# Patient Record
Sex: Male | Born: 1960 | Race: White | Hispanic: No | Marital: Single | Smoking: Never smoker
Health system: Southern US, Community
[De-identification: ages and names within clinical notes are randomized; demographics above are authoritative.]

## PROBLEM LIST (undated history)

## (undated) DIAGNOSIS — Z789 Other specified health status: Secondary | ICD-10-CM

## (undated) DIAGNOSIS — L570 Actinic keratosis: Secondary | ICD-10-CM

## (undated) HISTORY — DX: Actinic keratosis: L57.0

## (undated) HISTORY — PX: APPENDECTOMY: SHX54

---

## 2007-07-08 ENCOUNTER — Emergency Department: Payer: Self-pay | Admitting: Emergency Medicine

## 2011-05-16 ENCOUNTER — Emergency Department: Payer: Self-pay | Admitting: Emergency Medicine

## 2013-09-28 DIAGNOSIS — C4491 Basal cell carcinoma of skin, unspecified: Secondary | ICD-10-CM

## 2013-09-28 HISTORY — DX: Basal cell carcinoma of skin, unspecified: C44.91

## 2014-05-12 ENCOUNTER — Emergency Department: Payer: Self-pay | Admitting: Internal Medicine

## 2015-06-23 ENCOUNTER — Emergency Department
Admission: EM | Admit: 2015-06-23 | Discharge: 2015-06-23 | Disposition: A | Discharge status: Home / Self Care | Payer: PRIVATE HEALTH INSURANCE | Attending: Emergency Medicine | Admitting: Emergency Medicine

## 2015-06-23 ENCOUNTER — Encounter: Payer: Self-pay | Admitting: Urgent Care

## 2015-06-23 DIAGNOSIS — B349 Viral infection, unspecified: Secondary | ICD-10-CM | POA: Insufficient documentation

## 2015-06-23 DIAGNOSIS — R51 Headache: Secondary | ICD-10-CM | POA: Diagnosis present

## 2015-06-23 NOTE — ED Notes (Signed)
Patient with no complaints at this time. Respirations even and unlabored. Skin warm/dry. Discharge instructions reviewed with patient at this time. Patient given opportunity to voice concerns/ask questions. Patient discharged at this time and left Emergency Department with steady gait.   

## 2015-06-23 NOTE — ED Provider Notes (Signed)
[facility] Emergency Department Provider Note  ____________________________________________  Time seen: Approximately 5:10 AM  I have reviewed the triage vital signs and the nursing notes.   HISTORY  Chief Complaint Facial Pain and Headache    HPI Alexander Atkinson is a 55 y.o. male with no significant past medical history who presents with a generalized headache and some pain in his face as well as repeated sneezing.  He reports that this happened acutely earlier this afternoon while he was receiving facial laser treatments at his dermatologist's "skin spa".  He has never had a reaction like this before.  After sneezing multiple times he developed a frontal headache, but before coming to the emergency department he took 2 ibuprofen and the headache is since resolved.  He is having a runny nose and some congestion.  He denies ear pain and sore throat.  He denies fever/chills, chest pain, shortness of breath, nausea, vomiting, abdominal pain, diarrhea.   History reviewed. No pertinent past medical history.  There are no active problems to display for this patient.   Past Surgical History  Procedure Laterality Date  . Appendectomy      No current outpatient prescriptions on file.  Allergies Review of patient's allergies indicates no known allergies.  No family history on file.  Social History Social History  Substance Use Topics  . Smoking status: Never Smoker   . Smokeless tobacco: None  . Alcohol Use: No    Review of Systems Constitutional: No fever/chills Eyes: No visual changes. ENT: No sore throat.  Runny nose.  No ear pain. Cardiovascular: Denies chest pain. Respiratory: Denies shortness of breath. Gastrointestinal: No abdominal pain.  No nausea, no vomiting.  No diarrhea.  No constipation. Genitourinary: Negative for dysuria. Musculoskeletal: Negative for back pain. Skin: Negative for rash. Neurological: Negative for headaches, focal  weakness or numbness.  10-point ROS otherwise negative.  ____________________________________________   PHYSICAL EXAM:  ED Triage Vitals  Enc Vitals Group     BP 06/23/15 0526 148/88 mmHg     Pulse Rate 06/23/15 0526 75     Resp 06/23/15 0526 17     Temp --      Temp src --      SpO2 06/23/15 0526 99 %     Weight --      Height --      Head Cir --      Peak Flow --      Pain Score 06/23/15 0232 0     Pain Loc --      Pain Edu? --      Excl. in [place]? --     Constitutional: Alert and oriented. Well appearing and in no acute distress. Eyes: Conjunctivae are normal. PERRL. EOMI. Head: Atraumatic.  No maxillary or frontal sinus tenderness to palpation.  Ear canals and TMs are healthy and well-appearing. Nose: Mild clear rhinorrhea. Mouth/Throat: Mucous membranes are moist.  Oropharynx non-erythematous.  No exudate or petechiae on the palate. Neck: No stridor.  No meningismus. Cardiovascular: Normal rate, regular rhythm. Grossly normal heart sounds.  Good peripheral circulation. Respiratory: Normal respiratory effort.  No retractions. Lungs CTAB. Gastrointestinal: Soft and nontender. No distention. No abdominal bruits. No CVA tenderness. Skin:  Skin is warm, dry and intact. No rash noted. Psychiatric: Mood and affect are normal. Speech and behavior are normal.  ____________________________________________   LABS (all labs ordered are listed, but only abnormal results are displayed)  Labs Reviewed - No data to display ____________________________________________  EKG  None ____________________________________________  RADIOLOGY   No results found.  ____________________________________________   PROCEDURES  Procedure(s) performed: None  Critical Care performed: No ____________________________________________   INITIAL IMPRESSION / ASSESSMENT AND PLAN / ED COURSE  Pertinent labs & imaging results that were available during my care of the patient were  reviewed by me and considered in my medical decision making (see chart for details).  The patient's pain has resolved and he has symptoms most suggestive of a viral syndrome.  He himself states "I think I have a cold".  There is no indication for antibiotics for sinusitis.  He and I discussed obtaining imaging of his sinuses but I explained that I find it unlikely it would change our plan of care as antibiotics are really indicated in this type situation and he agrees with no additional evaluation at this time.  He is well-appearing and in no acute distress with normal vital signs.  I will discharge him for outpatient follow-up.  I gave my usual and customary return precautions.     ____________________________________________  FINAL CLINICAL IMPRESSION(S) / ED DIAGNOSES  Final diagnoses:  Viral syndrome      NEW MEDICATIONS STARTED DURING THIS VISIT:  New Prescriptions   No medications on file     Hinda Kehr, MD 06/23/15 0530

## 2015-06-23 NOTE — ED Notes (Signed)
Patient presents with non-specific headache and pain to his maxillary and frontal sinus area. Patient reports that pain somewhat subsided after taking some "equate". Patient advising that he started feeling bad yesterday after he went to have a "skin treatment" - "I started sneezing and I couldn't stop. I sneezed for quite awhile, but I dont have allergies". Patient presents to triage in face mask and gloves. Patient removed gloves for triage, but them promptly replaced them when triage completed. Patient states, "I never get sick. I dont go to doctors."

## 2015-06-23 NOTE — Discharge Instructions (Signed)
You have been seen in the Emergency Department (ED) today for a likely viral illness.  Please drink plenty of clear fluids (water, Gatorade, chicken broth, etc).  You may use Tylenol and/or Motrin according to label instructions.  You can alternate between the two without any side effects.   Please follow up with your doctor as listed above.  Call your doctor or return to the Emergency Department (ED) if you are unable to tolerate fluids due to vomiting, have worsening trouble breathing, become extremely tired or difficult to awaken, or if you develop any other symptoms that concern you.   Viral Infections A viral infection can be caused by different types of viruses.Most viral infections are not serious and resolve on their own. However, some infections may cause severe symptoms and may lead to further complications. SYMPTOMS Viruses can frequently cause:  Minor sore throat.  Aches and pains.  Headaches.  Runny nose.  Different types of rashes.  Watery eyes.  Tiredness.  Cough.  Loss of appetite.  Gastrointestinal infections, resulting in nausea, vomiting, and diarrhea. These symptoms do not respond to antibiotics because the infection is not caused by bacteria. However, you might catch a bacterial infection following the viral infection. This is sometimes called a "superinfection." Symptoms of such a bacterial infection may include:  Worsening sore throat with pus and difficulty swallowing.  Swollen neck glands.  Chills and a high or persistent fever.  Severe headache.  Tenderness over the sinuses.  Persistent overall ill feeling (malaise), muscle aches, and tiredness (fatigue).  Persistent cough.  Yellow, green, or brown mucus production with coughing. HOME CARE INSTRUCTIONS   Only take over-the-counter or prescription medicines for pain, discomfort, diarrhea, or fever as directed by your caregiver.  Drink enough water and fluids to keep your urine clear or  pale yellow. Sports drinks can provide valuable electrolytes, sugars, and hydration.  Get plenty of rest and maintain proper nutrition. Soups and broths with crackers or rice are fine. SEEK IMMEDIATE MEDICAL CARE IF:   You have severe headaches, shortness of breath, chest pain, neck pain, or an unusual rash.  You have uncontrolled vomiting, diarrhea, or you are unable to keep down fluids.  You or your child has an oral temperature above 102 F (38.9 C), not controlled by medicine.  Your baby is older than 3 months with a rectal temperature of 102 F (38.9 C) or higher.  Your baby is 58 months old or younger with a rectal temperature of 100.4 F (38 C) or higher. MAKE SURE YOU:   Understand these instructions.  Will watch your condition.  Will get help right away if you are not doing well or get worse.   This information is not intended to replace advice given to you by your health care provider. Make sure you discuss any questions you have with your health care provider.   Document Released: 02/07/2005 Document Revised: 07/23/2011 Document Reviewed: 10/06/2014 Elsevier Interactive Patient Education [employer].

## 2015-08-29 ENCOUNTER — Encounter: Payer: Self-pay | Admitting: Family Medicine

## 2015-08-29 ENCOUNTER — Ambulatory Visit (INDEPENDENT_AMBULATORY_CARE_PROVIDER_SITE_OTHER): Payer: PRIVATE HEALTH INSURANCE | Admitting: Family Medicine

## 2015-08-29 VITALS — BP 114/70 | HR 90 | Temp 97.5°F | Resp 16 | Ht 69.0 in | Wt 245.8 lb

## 2015-08-29 DIAGNOSIS — Z7689 Persons encountering health services in other specified circumstances: Secondary | ICD-10-CM

## 2015-08-29 DIAGNOSIS — E669 Obesity, unspecified: Secondary | ICD-10-CM | POA: Insufficient documentation

## 2015-08-29 DIAGNOSIS — Z0000 Encounter for general adult medical examination without abnormal findings: Secondary | ICD-10-CM | POA: Diagnosis not present

## 2015-08-29 DIAGNOSIS — Z7189 Other specified counseling: Secondary | ICD-10-CM

## 2015-08-29 NOTE — Progress Notes (Signed)
Name: Alexander Atkinson   MRN: [id number]    DOB: Oct 01, 1960   Date:08/29/2015       Progress Note  Subjective  Chief Complaint  Chief Complaint  Patient presents with  . Establish Care    HPI Here to establish care.  He has no major medical problems.  He takes no medicines.  His only c/o is that he needs a physical and needs a colonoscopy.  He is overweight.  No problem-specific assessment & plan notes found for this encounter.   History reviewed. No pertinent past medical history.  Past Surgical History  Procedure Laterality Date  . Appendectomy      Family History  Problem Relation Age of Onset  . Kidney disease Maternal Grandfather     Social History   Social History  . Marital Status: Single    Spouse Name: N/A  . Number of Children: N/A  . Years of Education: N/A   Occupational History  . Not on file.   Social History Main Topics  . Smoking status: Never Smoker   . Smokeless tobacco: Never Used  . Alcohol Use: No  . Drug Use: No  . Sexual Activity: Not on file   Other Topics Concern  . Not on file   Social History Narrative    No current outpatient prescriptions on file.  Not on File   Review of Systems  Constitutional: Negative for fever, chills, weight loss and malaise/fatigue.  HENT: Negative for hearing loss.   Eyes: Negative for blurred vision and double vision.  Respiratory: Negative for cough, shortness of breath and wheezing.   Cardiovascular: Negative for chest pain, palpitations and leg swelling.  Gastrointestinal: Negative for heartburn, abdominal pain and blood in stool.  Genitourinary: Negative for dysuria, urgency and frequency.  Musculoskeletal: Negative for myalgias and joint pain.  Skin: Negative for rash.  Neurological: Negative for dizziness, tremors, weakness and headaches.  Psychiatric/Behavioral: Negative for depression. The patient is not nervous/anxious and does not have insomnia.       Objective  Filed Vitals:   08/29/15 1036  BP: 114/70  Pulse: 90  Temp: 97.5 F (36.4 C)  TempSrc: Oral  Resp: 16  Height: 5\' 9"  (1.753 m)  Weight: 245 lb 12.8 oz (111.494 kg)    Physical Exam  Constitutional: He is oriented to person, place, and time. He appears distressed.  HENT:  Head: Normocephalic and atraumatic.  Eyes: Conjunctivae and EOM are normal. Pupils are equal, round, and reactive to light. No scleral icterus.  Neck: Normal range of motion. Neck supple. No thyromegaly present.  Cardiovascular: Normal rate, regular rhythm, normal heart sounds and intact distal pulses.  Exam reveals no gallop and no friction rub.   No murmur heard. Pulmonary/Chest: Effort normal and breath sounds normal. No respiratory distress. He has no wheezes. He has no rales.  Abdominal: Soft. Bowel sounds are normal. He exhibits no distension and no mass. There is no tenderness.  obese  Musculoskeletal: He exhibits no edema.  Lymphadenopathy:    He has no cervical adenopathy.  Neurological: He is alert and oriented to person, place, and time.  Skin: Skin is warm and dry. No rash noted. No erythema. No pallor.  Psychiatric: Mood, memory, affect and judgment normal.  Vitals reviewed.      No results found for this or any previous visit (from the past 2160 hour(s)).   Assessment & Plan  Problem List Items Addressed This Visit      Other   Obese  Relevant Orders   Comprehensive Metabolic Panel (CMET)   CBC with Differential   Lipid Profile   TSH   Encounter to establish care - Primary   Health care maintenance   Relevant Orders   Ambulatory referral to Gastroenterology   PSA      No orders of the defined types were placed in this encounter.   1. Encounter to establish care   2. Obese  - Comprehensive Metabolic Panel (CMET) - CBC with Differential - Lipid Profile - TSH  3. Health care maintenance  - Ambulatory referral to Gastroenterology - PSA

## 2015-08-30 LAB — CBC WITH DIFFERENTIAL/PLATELET
Basophils Absolute: 0.1 10*3/uL (ref 0.0–0.2)
Basos: 1 %
EOS (ABSOLUTE): 0.1 10*3/uL (ref 0.0–0.4)
EOS: 2 %
HEMATOCRIT: 45.9 % (ref 37.5–51.0)
HEMOGLOBIN: 16.1 g/dL (ref 12.6–17.7)
IMMATURE GRANS (ABS): 0 10*3/uL (ref 0.0–0.1)
IMMATURE GRANULOCYTES: 1 %
LYMPHS ABS: 2.1 10*3/uL (ref 0.7–3.1)
Lymphs: 34 %
MCH: 33.3 pg — ABNORMAL HIGH (ref 26.6–33.0)
MCHC: 35.1 g/dL (ref 31.5–35.7)
MCV: 95 fL (ref 79–97)
Monocytes Absolute: 0.6 10*3/uL (ref 0.1–0.9)
Monocytes: 10 %
Neutrophils Absolute: 3.3 10*3/uL (ref 1.4–7.0)
Neutrophils: 52 %
Platelets: 275 10*3/uL (ref 150–379)
RBC: 4.84 x10E6/uL (ref 4.14–5.80)
RDW: 13.5 % (ref 12.3–15.4)
WBC: 6.2 10*3/uL (ref 3.4–10.8)

## 2015-08-30 LAB — COMPREHENSIVE METABOLIC PANEL
A/G RATIO: 1.4 (ref 1.2–2.2)
ALBUMIN: 4.2 g/dL (ref 3.5–5.5)
ALK PHOS: 79 IU/L (ref 39–117)
ALT: 23 IU/L (ref 0–44)
AST: 21 IU/L (ref 0–40)
BILIRUBIN TOTAL: 0.8 mg/dL (ref 0.0–1.2)
BUN / CREAT RATIO: 21 — AB (ref 9–20)
BUN: 14 mg/dL (ref 6–24)
CO2: 21 mmol/L (ref 18–29)
CREATININE: 0.68 mg/dL — AB (ref 0.76–1.27)
Calcium: 9.2 mg/dL (ref 8.7–10.2)
Chloride: 100 mmol/L (ref 96–106)
GFR calc Af Amer: 123 mL/min/{1.73_m2} (ref >59)
GFR calc non Af Amer: 107 mL/min/{1.73_m2} (ref >59)
GLOBULIN, TOTAL: 3.1 g/dL (ref 1.5–4.5)
Glucose: 131 mg/dL — ABNORMAL HIGH (ref 65–99)
POTASSIUM: 4.2 mmol/L (ref 3.5–5.2)
SODIUM: 141 mmol/L (ref 134–144)
Total Protein: 7.3 g/dL (ref 6.0–8.5)

## 2015-08-30 LAB — LIPID PANEL
CHOL/HDL RATIO: 5.6 ratio — AB (ref 0.0–5.0)
Cholesterol, Total: 206 mg/dL — ABNORMAL HIGH (ref 100–199)
HDL: 37 mg/dL — AB (ref >39)
LDL CALC: 112 mg/dL — AB (ref 0–99)
TRIGLYCERIDES: 286 mg/dL — AB (ref 0–149)
VLDL CHOLESTEROL CAL: 57 mg/dL — AB (ref 5–40)

## 2015-08-30 LAB — PSA: Prostate Specific Ag, Serum: 0.6 ng/mL (ref 0.0–4.0)

## 2015-08-30 LAB — TSH: TSH: 2.85 u[IU]/mL (ref 0.450–4.500)

## 2015-08-31 LAB — SPECIMEN STATUS REPORT

## 2015-08-31 LAB — HGB A1C W/O EAG: Hgb A1c MFr Bld: 6.1 % — ABNORMAL HIGH (ref 4.8–5.6)

## 2015-09-14 ENCOUNTER — Telehealth: Payer: Self-pay | Admitting: Family Medicine

## 2015-09-14 NOTE — Telephone Encounter (Signed)
Patient made aware that Alexander Atkinson will call him to schedule and it make take several weeks. Patient given contact information to call.

## 2015-09-14 NOTE — Telephone Encounter (Signed)
Pt called to check the status of referral to Dr. Allen Norris for colonoscopy.  His call back  Is [phone]

## 2015-09-23 ENCOUNTER — Telehealth: Payer: Self-pay | Admitting: Gastroenterology

## 2015-09-23 NOTE — Telephone Encounter (Signed)
colonoscopy

## 2015-10-04 ENCOUNTER — Other Ambulatory Visit: Payer: Self-pay

## 2015-10-04 NOTE — Telephone Encounter (Signed)
Gastroenterology Pre-Procedure Review  Request Date: 11/22/15 Requesting Physician: Dr. Luan Pulling  PATIENT REVIEW QUESTIONS: The patient responded to the following health history questions as indicated:    1. Are you having any GI issues? no 2. Do you have a personal history of Polyps? no 3. Do you have a family history of Colon Cancer or Polyps? no 4. Diabetes Mellitus? no 5. Joint replacements in the past 12 months?no 6. Major health problems in the past 3 months?no 7. Any artificial heart valves, MVP, or defibrillator?no    MEDICATIONS & ALLERGIES:    Patient reports the following regarding taking any anticoagulation/antiplatelet therapy:   Plavix, Coumadin, Eliquis, Xarelto, Lovenox, Pradaxa, Brilinta, or Effient? no Aspirin? no  Patient confirms/reports the following medications:  No current outpatient prescriptions on file.   No current facility-administered medications for this visit.    Patient confirms/reports the following allergies:  Not on File  No orders of the defined types were placed in this encounter.    AUTHORIZATION INFORMATION Primary Insurance: 1D#: Group #:  Secondary Insurance: 1D#: Group #:  SCHEDULE INFORMATION: Date: 11/22/15 Time: Location: [facility]

## 2015-10-04 NOTE — Telephone Encounter (Signed)
Pt scheduled for screening colonoscopy at [facility] on 11/22/15. Please precert.

## 2015-10-25 ENCOUNTER — Encounter: Payer: Self-pay | Admitting: Emergency Medicine

## 2015-10-25 ENCOUNTER — Emergency Department
Admission: EM | Admit: 2015-10-25 | Discharge: 2015-10-25 | Disposition: A | Discharge status: Home / Self Care | Payer: PRIVATE HEALTH INSURANCE | Attending: Emergency Medicine | Admitting: Emergency Medicine

## 2015-10-25 ENCOUNTER — Emergency Department: Payer: PRIVATE HEALTH INSURANCE

## 2015-10-25 DIAGNOSIS — K6389 Other specified diseases of intestine: Secondary | ICD-10-CM

## 2015-10-25 DIAGNOSIS — K659 Peritonitis, unspecified: Secondary | ICD-10-CM | POA: Diagnosis not present

## 2015-10-25 DIAGNOSIS — R1032 Left lower quadrant pain: Secondary | ICD-10-CM | POA: Diagnosis present

## 2015-10-25 DIAGNOSIS — K529 Noninfective gastroenteritis and colitis, unspecified: Secondary | ICD-10-CM

## 2015-10-25 LAB — COMPREHENSIVE METABOLIC PANEL
ALT: 31 U/L (ref 17–63)
AST: 31 U/L (ref 15–41)
Albumin: 4.3 g/dL (ref 3.5–5.0)
Alkaline Phosphatase: 78 U/L (ref 38–126)
Anion gap: 11 (ref 5–15)
BILIRUBIN TOTAL: 0.9 mg/dL (ref 0.3–1.2)
BUN: 17 mg/dL (ref 6–20)
CALCIUM: 9.5 mg/dL (ref 8.9–10.3)
CO2: 22 mmol/L (ref 22–32)
CREATININE: 0.96 mg/dL (ref 0.61–1.24)
Chloride: 104 mmol/L (ref 101–111)
GFR calc Af Amer: >60 mL/min (ref >60)
Glucose, Bld: 104 mg/dL — ABNORMAL HIGH (ref 65–99)
Potassium: 3.8 mmol/L (ref 3.5–5.1)
Sodium: 137 mmol/L (ref 135–145)
TOTAL PROTEIN: 8 g/dL (ref 6.5–8.1)

## 2015-10-25 LAB — CBC
HCT: 45.2 % (ref 40.0–52.0)
Hemoglobin: 16 g/dL (ref 13.0–18.0)
MCH: 33 pg (ref 26.0–34.0)
MCHC: 35.4 g/dL (ref 32.0–36.0)
MCV: 93.1 fL (ref 80.0–100.0)
PLATELETS: 261 10*3/uL (ref 150–440)
RBC: 4.86 MIL/uL (ref 4.40–5.90)
RDW: 12.9 % (ref 11.5–14.5)
WBC: 7.6 10*3/uL (ref 3.8–10.6)

## 2015-10-25 LAB — URINALYSIS COMPLETE WITH MICROSCOPIC (ARMC ONLY)
BILIRUBIN URINE: NEGATIVE
Bacteria, UA: NONE SEEN
GLUCOSE, UA: NEGATIVE mg/dL
HGB URINE DIPSTICK: NEGATIVE
LEUKOCYTES UA: NEGATIVE
NITRITE: NEGATIVE
Protein, ur: NEGATIVE mg/dL
SPECIFIC GRAVITY, URINE: 1.023 (ref 1.005–1.030)
Squamous Epithelial / LPF: NONE SEEN
pH: 5 (ref 5.0–8.0)

## 2015-10-25 LAB — LIPASE, BLOOD: Lipase: 31 U/L (ref 11–51)

## 2015-10-25 MED ORDER — DIATRIZOATE MEGLUMINE & SODIUM 66-10 % PO SOLN
15.0000 mL | Freq: Once | ORAL | Status: AC
  Administered 2015-10-25: 15 mL via ORAL

## 2015-10-25 MED ORDER — ONDANSETRON 4 MG PO TBDP
4.0000 mg | ORAL_TABLET | Freq: Three times a day (TID) | ORAL | Status: DC | PRN
Start: 2015-10-25 — End: 2015-12-19

## 2015-10-25 MED ORDER — NAPROXEN 500 MG PO TABS: 500.0000 mg | ORAL_TABLET | Freq: Two times a day (BID) | ORAL | Status: DC

## 2015-10-25 MED ORDER — IOPAMIDOL (ISOVUE-300) INJECTION 61%
100.0000 mL | Freq: Once | INTRAVENOUS | Status: AC | PRN
  Administered 2015-10-25: 100 mL via INTRAVENOUS

## 2015-10-25 NOTE — ED Notes (Signed)
MD at bedside. 

## 2015-10-25 NOTE — ED Notes (Signed)
Pt here with LLQ pain, sent here from [facility]. Sent here for eval for diverticulitis.

## 2015-10-25 NOTE — ED Notes (Signed)
Patient transported to CT 

## 2015-10-25 NOTE — ED Provider Notes (Signed)
[facility] Emergency Department Provider Note  ____________________________________________  Time seen: 5:15 PM  I have reviewed the triage vital signs and the nursing notes.   HISTORY  Chief Complaint Abdominal Pain    HPI Tobe Kan is a 55 y.o. male who complains of left lower quadrant abdominal pain, gradual onset last night, constant, waxing and waning, overall worsening. Nonradiating. No nausea vomiting or diarrhea or other associated symptoms. Never had anything like this before. Sent from primary care for evaluation for diverticulitis. No history of diverticulitis or diverticulosis.  no active medical problems, takes no medications.   History reviewed. No pertinent past medical history.   Patient Active Problem List   Diagnosis Date Noted  . Obese 08/29/2015  . Encounter to establish care 08/29/2015  . Health care maintenance 08/29/2015     Past Surgical History  Procedure Laterality Date  . Appendectomy       Current Outpatient Rx  Name  Route  Sig  Dispense  Refill  . naproxen (NAPROSYN) 500 MG tablet   Oral   Take 1 tablet (500 mg total) by mouth 2 (two) times daily with a meal.   20 tablet   0   . ondansetron (ZOFRAN ODT) 4 MG disintegrating tablet   Oral   Take 1 tablet (4 mg total) by mouth every 8 (eight) hours as needed for nausea or vomiting.   20 tablet   0      Allergies Review of patient's allergies indicates no known allergies.   Family History  Problem Relation Age of Onset  . Kidney disease Maternal Grandfather     Social History Social History  Substance Use Topics  . Smoking status: Never Smoker   . Smokeless tobacco: Never Used  . Alcohol Use: No    Review of Systems  Constitutional:   No fever or chills.  Eyes:   No vision changes.  ENT:   No sore throat. No rhinorrhea. Cardiovascular:   No chest pain. Respiratory:   No dyspnea or cough. Gastrointestinal:  Positive left lower  quadrant abdominal pain without vomiting and diarrhea.  Genitourinary:   Negative for dysuria or difficulty urinating. Musculoskeletal:   Negative for focal pain or swelling Neurological:   Negative for headaches 10-point ROS otherwise negative.  ____________________________________________   PHYSICAL EXAM:  VITAL SIGNS: ED Triage Vitals  Enc Vitals Group     BP 10/25/15 1703 122/79 mmHg     Pulse Rate 10/25/15 1703 98     Resp 10/25/15 1703 16     Temp 10/25/15 1703 98.7 F (37.1 C)     Temp Source 10/25/15 1703 Oral     SpO2 10/25/15 1703 94 %     Weight 10/25/15 1703 228 lb (103.42 kg)     Height 10/25/15 1703 5\' 9"  (1.753 m)     Head Cir --      Peak Flow --      Pain Score 10/25/15 1703 4     Pain Loc --      Pain Edu? --      Excl. in [place]? --     Vital signs reviewed, nursing assessments reviewed.   Constitutional:   Alert and oriented. Well appearing and in no distress. Eyes:   No scleral icterus. No conjunctival pallor. PERRL. EOMI.  No nystagmus. ENT   Head:   Normocephalic and atraumatic.   Nose:   No congestion/rhinnorhea. No septal hematoma   Mouth/Throat:   MMM, no pharyngeal erythema.  No peritonsillar mass.    Neck:   No stridor. No SubQ emphysema. No meningismus. Hematological/Lymphatic/Immunilogical:   No cervical lymphadenopathy. Cardiovascular:   RRR. Symmetric bilateral radial and DP pulses.  No murmurs.  Respiratory:   Normal respiratory effort without tachypnea nor retractions. Breath sounds are clear and equal bilaterally. No wheezes/rales/rhonchi. Gastrointestinal:   Soft With focal tenderness in the left lower quadrant just lateral to the midline. No hernias.. Non distended. There is no CVA tenderness.  No rebound, rigidity, or guarding. Genitourinary:   deferred Musculoskeletal:   Nontender with normal range of motion in all extremities. No joint effusions.  No lower extremity tenderness.  No edema. Neurologic:   Normal speech and  language.  CN 2-10 normal. Motor grossly intact. No gross focal neurologic deficits are appreciated.  Skin:    Skin is warm, dry and intact. No rash noted.  No petechiae, purpura, or bullae.  ____________________________________________    LABS (pertinent positives/negatives) (all labs ordered are listed, but only abnormal results are displayed) Labs Reviewed  COMPREHENSIVE METABOLIC PANEL - Abnormal; Notable for the following:    Glucose, Bld 104 (*)    All other components within normal limits  URINALYSIS COMPLETEWITH MICROSCOPIC ([facility] ONLY) - Abnormal; Notable for the following:    Color, Urine YELLOW (*)    APPearance CLEAR (*)    Ketones, ur TRACE (*)    All other components within normal limits  LIPASE, BLOOD  CBC   ____________________________________________   EKG    ____________________________________________    RADIOLOGY  CT abdomen and pelvis reveals epiploic appendagitis of the descending colon.  ____________________________________________   PROCEDURES   ____________________________________________   INITIAL IMPRESSION / ASSESSMENT AND PLAN / ED COURSE  Pertinent labs & imaging results that were available during my care of the patient were reviewed by me and considered in my medical decision making (see chart for details).  Patient well appearing no acute distress. Normal vital signs. Normal white count. Epiploic appendagitis on CT. Does not warrant antibiotics at this time. Suitable for discharge home with NSAIDs, follow-up with primary care. Patient counseled on the usual course of this condition, usual return precautions given.     ____________________________________________   FINAL CLINICAL IMPRESSION(S) / ED DIAGNOSES  Final diagnoses:  Epiploic appendagitis  LLQ abdominal pain       Portions of this note were generated with dragon dictation software. Dictation errors may occur despite best attempts at  proofreading.   Carrie Mew, MD 10/25/15 [phone]

## 2015-10-25 NOTE — Discharge Instructions (Signed)

## 2015-11-22 ENCOUNTER — Ambulatory Visit: Payer: PRIVATE HEALTH INSURANCE | Admitting: Anesthesiology

## 2015-11-22 ENCOUNTER — Ambulatory Visit
Admission: RE | Admit: 2015-11-22 | Discharge: 2015-11-22 | Disposition: A | Discharge status: Home / Self Care | Payer: PRIVATE HEALTH INSURANCE | Source: Ambulatory Visit | Attending: Gastroenterology | Admitting: Gastroenterology

## 2015-11-22 ENCOUNTER — Encounter: Payer: Self-pay | Admitting: *Deleted

## 2015-11-22 ENCOUNTER — Encounter
Admission: RE | Disposition: A | Discharge status: Home / Self Care | Payer: Self-pay | Source: Ambulatory Visit | Attending: Gastroenterology

## 2015-11-22 DIAGNOSIS — K640 First degree hemorrhoids: Secondary | ICD-10-CM | POA: Insufficient documentation

## 2015-11-22 DIAGNOSIS — Z841 Family history of disorders of kidney and ureter: Secondary | ICD-10-CM | POA: Diagnosis not present

## 2015-11-22 DIAGNOSIS — Z79899 Other long term (current) drug therapy: Secondary | ICD-10-CM | POA: Insufficient documentation

## 2015-11-22 DIAGNOSIS — Z6832 Body mass index (BMI) 32.0-32.9, adult: Secondary | ICD-10-CM | POA: Diagnosis not present

## 2015-11-22 DIAGNOSIS — Z1211 Encounter for screening for malignant neoplasm of colon: Secondary | ICD-10-CM | POA: Insufficient documentation

## 2015-11-22 DIAGNOSIS — E669 Obesity, unspecified: Secondary | ICD-10-CM | POA: Insufficient documentation

## 2015-11-22 HISTORY — DX: Other specified health status: Z78.9

## 2015-11-22 HISTORY — PX: COLONOSCOPY WITH PROPOFOL: SHX5780

## 2015-11-22 SURGERY — COLONOSCOPY WITH PROPOFOL
Anesthesia: General

## 2015-11-22 MED ORDER — SODIUM CHLORIDE 0.9 % IV SOLN
INTRAVENOUS | Status: DC
  Administered 2015-11-22: 1000 mL via INTRAVENOUS

## 2015-11-22 MED ORDER — EPHEDRINE SULFATE 50 MG/ML IJ SOLN
INTRAMUSCULAR | Status: DC | PRN
  Administered 2015-11-22 (×2): 10 mg via INTRAVENOUS

## 2015-11-22 MED ORDER — PROPOFOL 10 MG/ML IV BOLUS
INTRAVENOUS | Status: DC | PRN
  Administered 2015-11-22: 40 mg via INTRAVENOUS

## 2015-11-22 MED ORDER — LACTATED RINGERS IV SOLN
INTRAVENOUS | Status: DC | PRN
  Administered 2015-11-22: 09:00:00 via INTRAVENOUS

## 2015-11-22 MED ORDER — PROPOFOL 500 MG/50ML IV EMUL
INTRAVENOUS | Status: DC | PRN
  Administered 2015-11-22: 100 ug/kg/min via INTRAVENOUS

## 2015-11-22 NOTE — H&P (Signed)
  Alexander Lame, MD Alexander [name], [place] [address] Phone: [phone] Fax : [phone]  Primary Care Physician:  Alexander Doe, MD Primary Gastroenterologist:  Dr. Allen Atkinson  Pre-Procedure History & Physical: HPI:  Alexander Atkinson is a 55 y.o. male is here for a screening colonoscopy.   Past Medical History  Diagnosis Date  . Medical history non-contributory     Past Surgical History  Procedure Laterality Date  . Appendectomy      Prior to Admission medications   Medication Sig Start Date End Date Taking? Authorizing Provider  naproxen (NAPROSYN) 500 MG tablet Take 1 tablet (500 mg total) by mouth 2 (two) times daily with a meal. 10/25/15   Alexander Mew, MD  ondansetron (ZOFRAN ODT) 4 MG disintegrating tablet Take 1 tablet (4 mg total) by mouth every 8 (eight) hours as needed for nausea or vomiting. 10/25/15   Alexander Mew, MD    Allergies as of 10/04/2015  . (No Known Allergies)    Family History  Problem Relation Age of Onset  . Kidney disease Maternal Grandfather     Social History   Social History  . Marital Status: Single    Spouse Name: Alexander Atkinson  . Number of Children: Alexander Atkinson  . Years of Education: Alexander Atkinson   Occupational History  . Not on file.   Social History Main Topics  . Smoking status: Never Smoker   . Smokeless tobacco: Never Used  . Alcohol Use: No  . Drug Use: No  . Sexual Activity: Not on file   Other Topics Concern  . Not on file   Social History Narrative    Review of Systems: See HPI, otherwise negative ROS  Physical Exam: BP 104/86 mmHg  Pulse 73  Temp(Src) 97.8 F (36.6 C) (Tympanic)  Resp 18  Ht 5\' 9"  (1.753 m)  Wt 221 lb (100.245 kg)  BMI 32.62 kg/m2  SpO2 99% General:   Alert,  pleasant and cooperative in NAD Head:  Normocephalic and atraumatic. Neck:  Supple; no masses or thyromegaly. Lungs:  Clear throughout to auscultation.    Heart:  Regular rate and rhythm. Abdomen:  Soft, nontender and  nondistended. Normal bowel sounds, without guarding, and without rebound.   Neurologic:  Alert and  oriented x4;  grossly normal neurologically.  Impression/Plan: Alexander Atkinson is now here to undergo a screening colonoscopy.  Risks, benefits, and alternatives regarding colonoscopy have been reviewed with the patient.  Questions have been answered.  All parties agreeable.

## 2015-11-22 NOTE — Anesthesia Preprocedure Evaluation (Signed)
Anesthesia Evaluation  Patient identified by MRN, date of birth, ID band Patient awake    Reviewed: Allergy & Precautions, NPO status , Patient's Chart, lab work & pertinent test results  History of Anesthesia Complications Negative for: history of anesthetic complications  Airway Mallampati: II       Dental  (+) Teeth Intact   Pulmonary neg pulmonary ROS,    breath sounds clear to auscultation       Cardiovascular Exercise Tolerance: Good negative cardio ROS   Rhythm:Regular     Neuro/Psych negative neurological ROS     GI/Hepatic negative GI ROS, Neg liver ROS,   Endo/Other  negative endocrine ROS  Renal/GU negative Renal ROS     Musculoskeletal   Abdominal (+) + obese,   Peds negative pediatric ROS (+)  Hematology negative hematology ROS (+)   Anesthesia Other Findings   Reproductive/Obstetrics                             Anesthesia Physical Anesthesia Plan  ASA: II  Anesthesia Plan: General   Post-op Pain Management:    Induction: Intravenous  Airway Management Planned: Natural Airway and Nasal Cannula  Additional Equipment:   Intra-op Plan:   Post-operative Plan:   Informed Consent: I have reviewed the patients History and Physical, chart, labs and discussed the procedure including the risks, benefits and alternatives for the proposed anesthesia with the patient or authorized representative who has indicated his/her understanding and acceptance.     Plan Discussed with: CRNA  Anesthesia Plan Comments:         Anesthesia Quick Evaluation

## 2015-11-22 NOTE — Op Note (Signed)
[facility] [place] [facility] Patient Name: Alexander Atkinson Procedure Date: 11/22/2015 8:49 AM MRN: [id number] Account #: [id number] Date of Birth: [birth year] Admit Type: Outpatient Age: 55 Room: [facility] ENDO ROOM 4 Gender: Male Note Status: Finalized Procedure:            Colonoscopy Indications:          Screening for colorectal malignant neoplasm Providers:            [name], MD Referring MD:         No Local Md, MD (Referring MD) Medicines:            Propofol per Anesthesia Complications:        No immediate complications. Procedure:            Pre-Anesthesia Assessment:                       - Prior to the procedure, a History and Physical was                        performed, and patient medications and allergies were                        reviewed. The patient's tolerance of previous                        anesthesia was also reviewed. The risks and benefits of                        the procedure and the sedation options and risks were                        discussed with the patient. All questions were                        answered, and informed consent was obtained. Prior                        Anticoagulants: The patient has taken no previous                        anticoagulant or antiplatelet agents. ASA Grade                        Assessment: II - A patient with mild systemic disease.                        After reviewing the risks and benefits, the patient was                        deemed in satisfactory condition to undergo the                        procedure.                       After obtaining informed consent, the colonoscope was                        passed under direct vision. Throughout the procedure,  the patient's blood pressure, pulse, and oxygen                        saturations were monitored continuously. The                        Colonoscope was introduced through the anus and   advanced to the the cecum, identified by appendiceal                        orifice and ileocecal valve. The colonoscopy was                        performed without difficulty. The patient tolerated the                        procedure well. The quality of the bowel preparation                        was excellent. Findings:      The perianal and digital rectal examinations were normal.      Non-bleeding internal hemorrhoids were found during retroflexion. The       hemorrhoids were Grade I (internal hemorrhoids that do not prolapse). Impression:           - Non-bleeding internal hemorrhoids.                       - No specimens collected. Recommendation:       - Repeat colonoscopy in 10 years for screening unless                        any change in family history or lower GI problems. Procedure Code(s):    --- Professional ---                       [phone], Colonoscopy, flexible; diagnostic, including                        collection of specimen(s) by brushing or washing, when                        performed (separate procedure) Diagnosis Code(s):    --- Professional ---                       Z12.11, Encounter for screening for malignant neoplasm                        of colon CPT copyright 2016 [employer]. All rights reserved. The codes documented in this report are preliminary and upon coder review may  be revised to meet current compliance requirements. [name], MD 11/22/2015 9:05:27 AM This report has been signed electronically. Number of Addenda: 0 Note Initiated On: 11/22/2015 8:49 AM Scope Withdrawal Time: 0 hours 6 minutes 30 seconds  Total Procedure Duration: 0 hours 8 minutes 0 seconds       [facility]

## 2015-11-22 NOTE — Anesthesia Postprocedure Evaluation (Signed)
Anesthesia Post Note  Patient: Alexander Atkinson  Procedure(s) Performed: Procedure(s) (LRB): COLONOSCOPY WITH PROPOFOL (N/A)  Patient location during evaluation: PACU Anesthesia Type: General Level of consciousness: awake Pain management: pain level controlled Vital Signs Assessment: post-procedure vital signs reviewed and stable Respiratory status: spontaneous breathing Cardiovascular status: stable Anesthetic complications: no    Last Vitals:  Filed Vitals:   11/22/15 0752 11/22/15 0912  BP: 104/86 100/60  Pulse: 73 89  Temp: 36.6 C 36.3 C  Resp: 18 22    Last Pain: There were no vitals filed for this visit.               VAN STAVEREN,Kaylen Motl

## 2015-11-22 NOTE — Transfer of Care (Signed)
Immediate Anesthesia Transfer of Care Note  Patient: Alexander Atkinson  Procedure(s) Performed: Procedure(s): COLONOSCOPY WITH PROPOFOL (N/A)  Patient Location: PACU  Anesthesia Type:General  Level of Consciousness: sedated  Airway & Oxygen Therapy: Patient Spontanous Breathing  Post-op Assessment: Post -op Vital signs reviewed and stable and Post -op Vital signs reviewed and unstable, Anesthesiologist notified  Post vital signs: Reviewed and stable  Last Vitals:  Filed Vitals:   11/22/15 0752  BP: 104/86  Pulse: 73  Temp: 36.6 C  Resp: 18    Last Pain: There were no vitals filed for this visit.       Complications: No apparent anesthesia complications

## 2015-11-27 ENCOUNTER — Encounter: Payer: Self-pay | Admitting: Gastroenterology

## 2015-12-19 ENCOUNTER — Encounter: Payer: Self-pay | Admitting: Family Medicine

## 2015-12-19 ENCOUNTER — Ambulatory Visit (INDEPENDENT_AMBULATORY_CARE_PROVIDER_SITE_OTHER): Payer: PRIVATE HEALTH INSURANCE | Admitting: Family Medicine

## 2015-12-19 VITALS — BP 121/86 | HR 68 | Temp 98.1°F | Ht 69.0 in | Wt 237.0 lb

## 2015-12-19 DIAGNOSIS — R7303 Prediabetes: Secondary | ICD-10-CM | POA: Insufficient documentation

## 2015-12-19 DIAGNOSIS — R7309 Other abnormal glucose: Secondary | ICD-10-CM | POA: Diagnosis not present

## 2015-12-19 DIAGNOSIS — E785 Hyperlipidemia, unspecified: Secondary | ICD-10-CM

## 2015-12-19 DIAGNOSIS — E782 Mixed hyperlipidemia: Secondary | ICD-10-CM | POA: Insufficient documentation

## 2015-12-19 DIAGNOSIS — E7800 Pure hypercholesterolemia, unspecified: Secondary | ICD-10-CM | POA: Insufficient documentation

## 2015-12-19 LAB — POCT GLYCOSYLATED HEMOGLOBIN (HGB A1C): Hemoglobin A1C: 6

## 2015-12-19 NOTE — Progress Notes (Signed)
Name: Alexander Atkinson   MRN: [id number]    DOB: [birth year]   Date:12/19/2015       Progress Note  Subjective  Chief Complaint  Chief Complaint  Patient presents with  . Hyperlipidemia  . Hyperglycemia    HPI Here for f/u of Hyperlipidemia, and pre-diabetes.  He is eating smarter and has lost some weight.  No c/o.  Was seen for abd pain in ER 2 months ago.  No operative abnormalityh found.    No problem-specific Assessment & Plan notes found for this encounter.   Past Medical History:  Diagnosis Date  . Medical history non-contributory     Past Surgical History:  Procedure Laterality Date  . APPENDECTOMY    . COLONOSCOPY WITH PROPOFOL N/A 11/22/2015   Procedure: COLONOSCOPY WITH PROPOFOL;  Surgeon: [name], MD;  Location: [facility] ENDOSCOPY;  Service: Endoscopy;  Laterality: N/A;    Family History  Problem Relation Age of Onset  . Kidney disease Maternal Grandfather     Social History   Social History  . Marital status: Single    Spouse name: N/A  . Number of children: N/A  . Years of education: N/A   Occupational History  . Not on file.   Social History Main Topics  . Smoking status: Never Smoker  . Smokeless tobacco: Never Used  . Alcohol use No  . Drug use: No  . Sexual activity: Not on file   Other Topics Concern  . Not on file   Social History Narrative  . No narrative on file    No current outpatient prescriptions on file.  Not on File   Review of Systems  Constitutional: Positive for weight loss (desired). Negative for chills, fever and malaise/fatigue.  HENT: Negative for hearing loss.   Eyes: Negative for blurred vision and double vision.  Respiratory: Negative for cough, shortness of breath and wheezing.   Cardiovascular: Negative for chest pain, palpitations and leg swelling.  Gastrointestinal: Negative for abdominal pain, blood in stool and heartburn.  Genitourinary: Negative for dysuria, frequency and urgency.  Musculoskeletal:  Negative for joint pain and myalgias.  Skin: Negative for rash.  Neurological: Negative for tremors, weakness and headaches.  Psychiatric/Behavioral: Negative for depression. The patient does not have insomnia.       Objective  Vitals:   12/19/15 0839  BP: 121/86  Pulse: 68  Temp: 98.1 F (36.7 C)  TempSrc: Oral  Weight: 237 lb (107.5 kg)  Height: '5\' 9"'$  (1.753 m)    Physical Exam  Constitutional: He is oriented to person, place, and time and well-developed, well-nourished, and in no distress. No distress.  HENT:  Head: Normocephalic and atraumatic.  Eyes: Conjunctivae and EOM are normal. Pupils are equal, round, and reactive to light. No scleral icterus.  Neck: Normal range of motion. Neck supple. Carotid bruit is not present. No thyromegaly present.  Cardiovascular: Normal rate, regular rhythm, normal heart sounds and intact distal pulses.  Exam reveals no gallop and no friction rub.   No murmur heard. Pulmonary/Chest: Effort normal and breath sounds normal. No respiratory distress. He has no wheezes. He has no rales.  Abdominal: Soft. Bowel sounds are normal. He exhibits no distension, no abdominal bruit and no mass. There is no tenderness.  Musculoskeletal: He exhibits no edema.  Lymphadenopathy:    He has no cervical adenopathy.  Neurological: He is alert and oriented to person, place, and time.  Skin: Skin is warm and dry.       Recent  Results (from the past 2160 hour(s))  Lipase, blood     Status: None   Collection Time: 10/25/15  5:04 PM  Result Value Ref Range   Lipase 31 11 - 51 U/L  Comprehensive metabolic panel     Status: Abnormal   Collection Time: 10/25/15  5:04 PM  Result Value Ref Range   Sodium 137 135 - 145 mmol/L   Potassium 3.8 3.5 - 5.1 mmol/L   Chloride 104 101 - 111 mmol/L   CO2 22 22 - 32 mmol/L   Glucose, Bld 104 (H) 65 - 99 mg/dL   BUN 17 6 - 20 mg/dL   Creatinine, Ser 5.53 0.61 - 1.24 mg/dL   Calcium 9.5 8.9 - 61.2 mg/dL   Total  Protein 8.0 6.5 - 8.1 g/dL   Albumin 4.3 3.5 - 5.0 g/dL   AST 31 15 - 41 U/L   ALT 31 17 - 63 U/L   Alkaline Phosphatase 78 38 - 126 U/L   Total Bilirubin 0.9 0.3 - 1.2 mg/dL   GFR calc non Af Amer >60 >60 mL/min   GFR calc Af Amer >60 >60 mL/min    Comment: (NOTE) The eGFR has been calculated using the CKD EPI equation. This calculation has not been validated in all clinical situations. eGFR's persistently <60 mL/min signify possible Chronic Kidney Disease.    Anion gap 11 5 - 15  CBC     Status: None   Collection Time: 10/25/15  5:04 PM  Result Value Ref Range   WBC 7.6 3.8 - 10.6 K/uL   RBC 4.86 4.40 - 5.90 MIL/uL   Hemoglobin 16.0 13.0 - 18.0 g/dL   HCT 55.5 78.3 - 05.2 %   MCV 93.1 80.0 - 100.0 fL   MCH 33.0 26.0 - 34.0 pg   MCHC 35.4 32.0 - 36.0 g/dL   RDW 81.8 85.7 - 71.1 %   Platelets 261 150 - 440 K/uL  Urinalysis complete, with microscopic     Status: Abnormal   Collection Time: 10/25/15  5:17 PM  Result Value Ref Range   Color, Urine YELLOW (A) YELLOW   APPearance CLEAR (A) CLEAR   Glucose, UA NEGATIVE NEGATIVE mg/dL   Bilirubin Urine NEGATIVE NEGATIVE   Ketones, ur TRACE (A) NEGATIVE mg/dL   Specific Gravity, Urine 1.023 1.005 - 1.030   Hgb urine dipstick NEGATIVE NEGATIVE   pH 5.0 5.0 - 8.0   Protein, ur NEGATIVE NEGATIVE mg/dL   Nitrite NEGATIVE NEGATIVE   Leukocytes, UA NEGATIVE NEGATIVE   RBC / HPF 0-5 0 - 5 RBC/hpf   WBC, UA 0-5 0 - 5 WBC/hpf   Bacteria, UA NONE SEEN NONE SEEN   Squamous Epithelial / LPF NONE SEEN NONE SEEN   Mucous PRESENT   POCT HgB A1C     Status: Abnormal   Collection Time: 12/19/15  8:54 AM  Result Value Ref Range   Hemoglobin A1C 6.0%      Assessment & Plan  Problem List Items Addressed This Visit      Other   Pre-diabetes   Hyperlipidemia   Relevant Orders   Lipid Profile    Other Visit Diagnoses    Elevated glucose    -  Primary   Relevant Orders   POCT HgB A1C (Completed)      No orders of the defined  types were placed in this encounter.  1. Elevated glucose  - POCT HgB A1C-6.0  2. Pre-diabetes   3. Hyperlipidemia  - Lipid  Profile

## 2015-12-20 ENCOUNTER — Other Ambulatory Visit: Payer: PRIVATE HEALTH INSURANCE

## 2015-12-20 LAB — LIPID PANEL
CHOL/HDL RATIO: 3.5 ratio (ref <=5.0)
CHOLESTEROL: 201 mg/dL — AB (ref 125–200)
HDL: 57 mg/dL (ref >=40)
LDL Cholesterol: 129 mg/dL (ref <130)
Triglycerides: 75 mg/dL (ref <150)
VLDL: 15 mg/dL (ref <30)

## 2016-04-02 ENCOUNTER — Other Ambulatory Visit: Payer: Self-pay | Admitting: Family Medicine

## 2016-04-02 ENCOUNTER — Other Ambulatory Visit (INDEPENDENT_AMBULATORY_CARE_PROVIDER_SITE_OTHER): Payer: PRIVATE HEALTH INSURANCE

## 2016-04-02 DIAGNOSIS — Z23 Encounter for immunization: Secondary | ICD-10-CM

## 2016-04-02 DIAGNOSIS — E785 Hyperlipidemia, unspecified: Secondary | ICD-10-CM

## 2016-04-03 LAB — LIPID PANEL
Cholesterol: 145 mg/dL (ref <200)
HDL: 38 mg/dL — AB (ref >40)
LDL Cholesterol: 92 mg/dL (ref <100)
TRIGLYCERIDES: 73 mg/dL (ref <150)
Total CHOL/HDL Ratio: 3.8 Ratio (ref <5.0)
VLDL: 15 mg/dL (ref <30)

## 2016-06-18 ENCOUNTER — Encounter: Payer: Self-pay | Admitting: Family Medicine

## 2016-06-18 ENCOUNTER — Ambulatory Visit (INDEPENDENT_AMBULATORY_CARE_PROVIDER_SITE_OTHER): Payer: PRIVATE HEALTH INSURANCE | Admitting: Family Medicine

## 2016-06-18 VITALS — BP 110/65 | HR 71 | Temp 98.0°F | Resp 16 | Ht 69.0 in | Wt 226.0 lb

## 2016-06-18 DIAGNOSIS — E784 Other hyperlipidemia: Secondary | ICD-10-CM

## 2016-06-18 DIAGNOSIS — R7303 Prediabetes: Secondary | ICD-10-CM | POA: Diagnosis not present

## 2016-06-18 DIAGNOSIS — E7849 Other hyperlipidemia: Secondary | ICD-10-CM

## 2016-06-18 NOTE — Progress Notes (Signed)
Name: Alexander Atkinson   MRN: [id number]    DOB: [birth year]   Date:06/18/2016       Progress Note  Subjective  Chief Complaint  Chief Complaint  Patient presents with  . Hypertension  . Hyperglycemia    HPI Here for f/u of pre-diabetes.  His BP has been ok.  He is feeling well.  Has been eating better and starting to exercise.  No problem-specific Assessment & Plan notes found for this encounter.   Past Medical History:  Diagnosis Date  . Medical history non-contributory     Past Surgical History:  Procedure Laterality Date  . APPENDECTOMY    . COLONOSCOPY WITH PROPOFOL N/A 11/22/2015   Procedure: COLONOSCOPY WITH PROPOFOL;  Surgeon: [name], MD;  Location: [facility] ENDOSCOPY;  Service: Endoscopy;  Laterality: N/A;    Family History  Problem Relation Age of Onset  . Kidney disease Maternal Grandfather     Social History   Social History  . Marital status: Single    Spouse name: N/A  . Number of children: N/A  . Years of education: N/A   Occupational History  . Not on file.   Social History Main Topics  . Smoking status: Never Smoker  . Smokeless tobacco: Never Used  . Alcohol use No  . Drug use: No  . Sexual activity: Not on file   Other Topics Concern  . Not on file   Social History Narrative  . No narrative on file    No current outpatient prescriptions on file.  Not on File   Review of Systems  Constitutional: Positive for weight loss (desired). Negative for chills, fever and malaise/fatigue.  HENT: Negative for hearing loss and tinnitus.   Eyes: Negative for blurred vision and double vision.  Respiratory: Negative for cough, shortness of breath and wheezing.   Cardiovascular: Negative for chest pain, palpitations and leg swelling.  Gastrointestinal: Negative for abdominal pain, blood in stool and heartburn.  Genitourinary: Negative for dysuria, frequency and urgency.  Musculoskeletal: Negative for back pain and myalgias.  Skin: Negative for  rash.  Neurological: Negative for dizziness, tingling, tremors, weakness and headaches.      Objective  Vitals:   06/18/16 0827 06/18/16 0853  BP: (!) 108/42 110/65  Pulse: 71   Resp: 16   Temp: 98 F (36.7 C)   TempSrc: Oral   Weight: 226 lb (102.5 kg)   Height: 5\' 9"  (1.753 m)     Physical Exam  Constitutional: He is oriented to person, place, and time and well-developed, well-nourished, and in no distress. No distress.  HENT:  Head: Normocephalic and atraumatic.  Eyes: Conjunctivae and EOM are normal. Pupils are equal, round, and reactive to light. No scleral icterus.  Neck: Normal range of motion. Neck supple. Carotid bruit is not present. No thyromegaly present.  Cardiovascular: Normal rate, regular rhythm and normal heart sounds.  Exam reveals no gallop and no friction rub.   No murmur heard. Pulmonary/Chest: Effort normal and breath sounds normal. No respiratory distress. He has no wheezes. He has no rales.  Abdominal: Soft. Bowel sounds are normal. He exhibits no distension, no abdominal bruit and no mass. There is no tenderness.  Musculoskeletal: He exhibits no edema.  Lymphadenopathy:    He has no cervical adenopathy.  Neurological: He is alert and oriented to person, place, and time.  Vitals reviewed.      Recent Results (from the past 2160 hour(s))  Lipid Profile     Status: Abnormal  Collection Time: 04/02/16 12:01 AM  Result Value Ref Range   Cholesterol 145 <200 mg/dL    Comment: ** Please note change in reference range(s). **      Triglycerides 73 <150 mg/dL    Comment: ** Please note change in reference range(s). **      HDL 38 (L) >40 mg/dL    Comment: ** Please note change in reference range(s). **      Total CHOL/HDL Ratio 3.8 <5.0 Ratio   VLDL 15 <30 mg/dL   LDL Cholesterol 92 <100 mg/dL    Comment: ** Please note change in reference range(s). **        Assessment & Plan  Problem List Items Addressed This Visit      Other    Pre-diabetes - Primary   Relevant Orders   COMPLETE METABOLIC PANEL WITH GFR   CBC with Differential   HgB A1c   Hyperlipidemia   Relevant Orders   Lipid Profile      No orders of the defined types were placed in this encounter.  1. Pre-diabetes  - COMPLETE METABOLIC PANEL WITH GFR - CBC with Differential - HgB A1c  2. Other hyperlipidemia  - Lipid Profile

## 2016-06-19 LAB — HEMOGLOBIN A1C
Hgb A1c MFr Bld: 5.2 % (ref <5.7)
MEAN PLASMA GLUCOSE: 103 mg/dL

## 2016-06-28 ENCOUNTER — Other Ambulatory Visit: Payer: PRIVATE HEALTH INSURANCE

## 2016-06-28 LAB — CBC WITH DIFFERENTIAL/PLATELET
BASOS PCT: 1 %
Basophils Absolute: 54 cells/uL (ref 0–200)
EOS ABS: 108 {cells}/uL (ref 15–500)
Eosinophils Relative: 2 %
HEMATOCRIT: 44.5 % (ref 38.5–50.0)
Hemoglobin: 15 g/dL (ref 13.2–17.1)
LYMPHS ABS: 1890 {cells}/uL (ref 850–3900)
LYMPHS PCT: 35 %
MCH: 32.9 pg (ref 27.0–33.0)
MCHC: 33.7 g/dL (ref 32.0–36.0)
MCV: 97.6 fL (ref 80.0–100.0)
MONO ABS: 648 {cells}/uL (ref 200–950)
MPV: 11 fL (ref 7.5–12.5)
Monocytes Relative: 12 %
NEUTROS ABS: 2700 {cells}/uL (ref 1500–7800)
Neutrophils Relative %: 50 %
Platelets: 253 10*3/uL (ref 140–400)
RBC: 4.56 MIL/uL (ref 4.20–5.80)
RDW: 13.5 % (ref 11.0–15.0)
WBC: 5.4 10*3/uL (ref 3.8–10.8)

## 2016-06-29 LAB — LIPID PANEL
CHOL/HDL RATIO: 3.9 ratio (ref <5.0)
CHOLESTEROL: 172 mg/dL (ref <200)
HDL: 44 mg/dL (ref >40)
LDL CALC: 116 mg/dL — AB (ref <100)
TRIGLYCERIDES: 59 mg/dL (ref <150)
VLDL: 12 mg/dL (ref <30)

## 2016-06-29 LAB — COMPLETE METABOLIC PANEL WITH GFR
ALT: 26 U/L (ref 9–46)
AST: 26 U/L (ref 10–35)
Albumin: 4 g/dL (ref 3.6–5.1)
Alkaline Phosphatase: 73 U/L (ref 40–115)
BUN: 19 mg/dL (ref 7–25)
CALCIUM: 9 mg/dL (ref 8.6–10.3)
CHLORIDE: 105 mmol/L (ref 98–110)
CO2: 24 mmol/L (ref 20–31)
CREATININE: 0.87 mg/dL (ref 0.70–1.33)
Glucose, Bld: 87 mg/dL (ref 65–99)
POTASSIUM: 4.2 mmol/L (ref 3.5–5.3)
Sodium: 139 mmol/L (ref 135–146)
Total Bilirubin: 1.3 mg/dL — ABNORMAL HIGH (ref 0.2–1.2)
Total Protein: 6.9 g/dL (ref 6.1–8.1)

## 2016-12-18 ENCOUNTER — Ambulatory Visit (INDEPENDENT_AMBULATORY_CARE_PROVIDER_SITE_OTHER): Payer: PRIVATE HEALTH INSURANCE | Admitting: Family Medicine

## 2016-12-18 ENCOUNTER — Encounter: Payer: Self-pay | Admitting: Family Medicine

## 2016-12-18 VITALS — BP 110/76 | HR 70 | Temp 98.0°F | Resp 16 | Ht 69.0 in | Wt 226.0 lb

## 2016-12-18 DIAGNOSIS — R7303 Prediabetes: Secondary | ICD-10-CM

## 2016-12-18 DIAGNOSIS — E7800 Pure hypercholesterolemia, unspecified: Secondary | ICD-10-CM

## 2016-12-18 DIAGNOSIS — E669 Obesity, unspecified: Secondary | ICD-10-CM | POA: Diagnosis not present

## 2016-12-18 LAB — POCT GLYCOSYLATED HEMOGLOBIN (HGB A1C): HEMOGLOBIN A1C: 6.1 — AB (ref ?–5.7)

## 2016-12-18 NOTE — Assessment & Plan Note (Signed)
Still improved wt from 1 year ago, but now seems gaining back wt after less diet and exercise in 6 months. Increasing PreDM A1c now - Emphasized importance of adherence back to diet/exercise program that worked before - Follow-up 3 months PreDM A1c, then 06/2017 for annual phys + labs

## 2016-12-18 NOTE — Progress Notes (Signed)
Subjective:    Patient ID: Alexander Atkinson, male    DOB: 10/30/1960, 56 y.o.   MRN: [id number]  Alexander Atkinson is a 56 y.o. male presenting on 12/18/2016 for prediabetes   HPI   Pre-Diabetes / Obesity BMI >33 Reports that he has changed lifestyle a lot within past 1 year. He just started going to doctor over past >1 year. He never had prior had much lab testing or A1c in past. Had A1c 6.0 (12/2015) with new dx Pre-DM then improved lifestyle and diet and lost 30 lbs since 08/2015 to 05/2016, and A1c down to 5.2, now today with elevated A1c again after not adhering to as much diet, 6.1 CBGs: Does not check Meds: Never on meds Currently not on ACE/ARB Lifestyle: - Diet (Dramatic improved diet with healthy almond snacks, completely quit soft drinks, now drinks mostly water)  - Exercise (Recently renewed membership to [facility] in 05/2016, he had minor low back strain/muscle spasm in April, but now resolved, in past he was using walking track indoors and playing racquet ) - Weight, in past was able to lose up to 30 lbs with lifestyle changes, but then gained some wt back nearly 20 lbs in past 6 months after less activity/diet Denies hypoglycemia, polyuria, visual changes, numbness or tingling.  HYPERLIPIDEMIA: - Reports no concerns. Last lipid panel 06/2016, mostly controlled except slightly elevated LDL - Never on statin or other cholesterol med before - Never on ASA  - Fam history of HTN, but no known CAD / MI / CVA  Health Maintenance: - Due for influenza vaccine, he will get in Fall 2018 - Offered routine screening Hepatitis C and HIV, patient declined, stated monogamous and never had blood transfusion he does not want to check screening tests  Social History  Substance Use Topics  . Smoking status: Never Smoker  . Smokeless tobacco: Never Used  . Alcohol use No    Review of Systems Per HPI unless specifically indicated above     Objective:    BP 110/76   Pulse 70   Temp 98 F  (36.7 C) (Oral)   Resp 16   Ht 5\' 9"  (1.753 m)   Wt 226 lb (102.5 kg)   BMI 33.37 kg/m   Wt Readings from Last 3 Encounters:  12/18/16 226 lb (102.5 kg)  06/18/16 226 lb (102.5 kg)  12/19/15 237 lb (107.5 kg)    Physical Exam  Constitutional: He is oriented to person, place, and time. He appears well-developed and well-nourished. No distress.  Well-appearing, comfortable, cooperative  HENT:  Head: Normocephalic and atraumatic.  Mouth/Throat: Oropharynx is clear and moist.  Eyes: Conjunctivae are normal. Right eye exhibits no discharge. Left eye exhibits no discharge.  Cardiovascular: Normal rate, regular rhythm, normal heart sounds and intact distal pulses.   No murmur heard. Pulmonary/Chest: Effort normal and breath sounds normal. No respiratory distress. He has no wheezes. He has no rales.  Musculoskeletal: He exhibits no edema.  Neurological: He is alert and oriented to person, place, and time.  Skin: Skin is warm and dry. No rash noted. He is not diaphoretic. No erythema.  Psychiatric: He has a normal mood and affect. His behavior is normal.  Well groomed, good eye contact, normal speech and thoughts  Nursing note and vitals reviewed.     Recent Labs  06/18/16 0001 12/18/16 0827  HGBA1C 5.2 6.1*    Results for orders placed or performed in visit on 12/18/16  POCT HgB A1C  Result Value Ref Range   Hemoglobin A1C 6.1 (A) 5.7      Assessment & Plan:   Problem List Items Addressed This Visit    Pre-diabetes - Primary    Had been well controlled, now mild worsening control Pre-DM with A1c 6.1 (increased from prior 5.2 after lifestyle changes improved) Concern with obesity, HLD  Plan:  1. Not on any therapy currently  2. Encourage improved lifestyle - resume prior plan - and handout given for glycemic index of food choices, low carb, low sugar diet, reduce portion size, restart regular exercise, remain off sodas 3. Follow-up 3 months Pre-DM A1c poc      Relevant  Orders   POCT HgB A1C (Completed)   Obesity (BMI 30.0-34.9)    Still improved wt from 1 year ago, but now seems gaining back wt after less diet and exercise in 6 months. Increasing PreDM A1c now - Emphasized importance of adherence back to diet/exercise program that worked before - Follow-up 3 months PreDM A1c, then 06/2017 for annual phys + labs      Hyperlipidemia    Mostly controlled cholesterol on prior lifestyle Last lipid panel 06/2016  Plan: 1. Never on statin or cholesterol med before 2. Discussion briefly today on reducing ASCVD risk, offered ASA 81 but advised we can wait until 06/2017 to check fasting lipids after lifestyle changes and determine calculation and risk 3. Encourage improved lifestyle - resume prior lifestyle changes with low carb/cholesterol, reduce portion size, continue improving regular exercise 4. Follow-up 6 months, 06/2017 for fasting labs annual phys         No orders of the defined types were placed in this encounter.     Follow up plan: Return in about 3 months (around 03/20/2017) for Pre-Diabetes A1c.   A total of 25 minutes was spent face-to-face with this patient. Greater than 50% of this time was spent in counseling Pre-Diabetes, nutrition, and progression to Diabetes, risk and complications, and reducing ASCVD risk with regards to Cholesterol lab results.  Nobie Putnam, [facility] 12/18/2016, 9:05 AM

## 2016-12-18 NOTE — Assessment & Plan Note (Signed)
Mostly controlled cholesterol on prior lifestyle Last lipid panel 06/2016  Plan: 1. Never on statin or cholesterol med before 2. Discussion briefly today on reducing ASCVD risk, offered ASA 81 but advised we can wait until 06/2017 to check fasting lipids after lifestyle changes and determine calculation and risk 3. Encourage improved lifestyle - resume prior lifestyle changes with low carb/cholesterol, reduce portion size, continue improving regular exercise 4. Follow-up 6 months, 06/2017 for fasting labs annual phys

## 2016-12-18 NOTE — Assessment & Plan Note (Signed)
Had been well controlled, now mild worsening control Pre-DM with A1c 6.1 (increased from prior 5.2 after lifestyle changes improved) Concern with obesity, HLD  Plan:  1. Not on any therapy currently  2. Encourage improved lifestyle - resume prior plan - and handout given for glycemic index of food choices, low carb, low sugar diet, reduce portion size, restart regular exercise, remain off sodas 3. Follow-up 3 months Pre-DM A1c poc

## 2016-12-18 NOTE — Patient Instructions (Addendum)
Thank you for coming to the clinic today.  1. Pre-Diabetes, A1c increased from 5.2 up to 6.1 - Most likely with dietary changes and less active with reduced exercise - Agree with plan to continue improving diet and resume gym exercise plan  2. Will anticipate checking A1c every 3 months for now, until February 2018 when we do all blood work, we can also discuss cholesterol meds and aspirin for risk reduction again IF needed at that time.  Please schedule a Follow-up Appointment to: Return in about 3 months (around 03/20/2017) for Pre-Diabetes A1c.  If you have any other questions or concerns, please feel free to call the clinic or send a message through [place]. You may also schedule an earlier appointment if necessary.  Additionally, you may be receiving a survey about your experience at our clinic within a few days to 1 week by e-mail or mail. We value your feedback.  Nobie Putnam, DO [place]

## 2016-12-30 ENCOUNTER — Emergency Department
Admission: EM | Admit: 2016-12-30 | Discharge: 2016-12-30 | Disposition: A | Discharge status: Home / Self Care | Payer: PRIVATE HEALTH INSURANCE | Attending: Emergency Medicine | Admitting: Emergency Medicine

## 2016-12-30 DIAGNOSIS — Z79899 Other long term (current) drug therapy: Secondary | ICD-10-CM | POA: Diagnosis not present

## 2016-12-30 DIAGNOSIS — B084 Enteroviral vesicular stomatitis with exanthem: Secondary | ICD-10-CM | POA: Diagnosis not present

## 2016-12-30 DIAGNOSIS — R21 Rash and other nonspecific skin eruption: Secondary | ICD-10-CM | POA: Diagnosis present

## 2016-12-30 LAB — CBC WITH DIFFERENTIAL/PLATELET
Basophils Absolute: 0.1 10*3/uL (ref 0–0.1)
Basophils Relative: 1 %
EOS PCT: 1 %
Eosinophils Absolute: 0.1 10*3/uL (ref 0–0.7)
HCT: 43.3 % (ref 40.0–52.0)
Hemoglobin: 15.3 g/dL (ref 13.0–18.0)
LYMPHS ABS: 1.7 10*3/uL (ref 1.0–3.6)
Lymphocytes Relative: 17 %
MCH: 34 pg (ref 26.0–34.0)
MCHC: 35.4 g/dL (ref 32.0–36.0)
MCV: 95.9 fL (ref 80.0–100.0)
MONO ABS: 1.1 10*3/uL — AB (ref 0.2–1.0)
MONOS PCT: 12 %
Neutro Abs: 6.7 10*3/uL — ABNORMAL HIGH (ref 1.4–6.5)
Neutrophils Relative %: 69 %
PLATELETS: 216 10*3/uL (ref 150–440)
RBC: 4.51 MIL/uL (ref 4.40–5.90)
RDW: 12.7 % (ref 11.5–14.5)
WBC: 9.7 10*3/uL (ref 3.8–10.6)

## 2016-12-30 LAB — COMPREHENSIVE METABOLIC PANEL
ALT: 59 U/L (ref 17–63)
AST: 52 U/L — ABNORMAL HIGH (ref 15–41)
Albumin: 3.7 g/dL (ref 3.5–5.0)
Alkaline Phosphatase: 59 U/L (ref 38–126)
Anion gap: 10 (ref 5–15)
BUN: 15 mg/dL (ref 6–20)
CALCIUM: 8.6 mg/dL — AB (ref 8.9–10.3)
CHLORIDE: 108 mmol/L (ref 101–111)
CO2: 21 mmol/L — ABNORMAL LOW (ref 22–32)
CREATININE: 0.72 mg/dL (ref 0.61–1.24)
Glucose, Bld: 96 mg/dL (ref 65–99)
POTASSIUM: 3.7 mmol/L (ref 3.5–5.1)
Sodium: 139 mmol/L (ref 135–145)
Total Bilirubin: 1.4 mg/dL — ABNORMAL HIGH (ref 0.3–1.2)
Total Protein: 7.4 g/dL (ref 6.5–8.1)

## 2016-12-30 LAB — POCT RAPID STREP A: STREPTOCOCCUS, GROUP A SCREEN (DIRECT): NEGATIVE

## 2016-12-30 MED ORDER — HYDROXYZINE HCL 25 MG PO TABS
25.0000 mg | ORAL_TABLET | Freq: Once | ORAL | Status: AC
  Administered 2016-12-30: 25 mg via ORAL
  Filled 2016-12-30: qty 1

## 2016-12-30 MED ORDER — HYDROXYZINE HCL 25 MG PO TABS: 25.0000 mg | ORAL_TABLET | Freq: Four times a day (QID) | ORAL | 0 refills | Status: DC | PRN

## 2016-12-30 MED ORDER — HYDROCODONE-ACETAMINOPHEN 5-325 MG PO TABS: 1.0000 | ORAL_TABLET | Freq: Four times a day (QID) | ORAL | 0 refills | Status: DC | PRN

## 2016-12-30 NOTE — ED Provider Notes (Signed)
[facility] Emergency Department Provider Note  ____________________________________________   First MD Initiated Contact with Patient 12/30/16 1056     (approximate)  I have reviewed the triage vital signs and the nursing notes.   HISTORY  Chief Complaint Rash    HPI Alexander Atkinson is a 56 y.o. male is here with complaint of rash to hands and feet for the last 2 days. Patient states that last evening he began having a sore throat. During this time he has had a fever which is been low-grade. He denies any vomiting or diarrhea. He is unaware of any exposure to small children. He denies any tick bites. He states mostly he stays in [facility] He states the rash itches and hurts. He denies any headaches. He has not taken any medication for the itching but has been using some cortisone cream that was prescribed for him by [facility]. Currently rates his pain as a 10 over 10.   Past Medical History:  Diagnosis Date  . Medical history non-contributory     Patient Active Problem List   Diagnosis Date Noted  . Pre-diabetes 12/19/2015  . Hyperlipidemia 12/19/2015  . Special screening for malignant neoplasms, colon   . Obesity (BMI 30.0-34.9) 08/29/2015    Past Surgical History:  Procedure Laterality Date  . APPENDECTOMY    . COLONOSCOPY WITH PROPOFOL N/A 11/22/2015   Procedure: COLONOSCOPY WITH PROPOFOL;  Surgeon: [name], MD;  Location: [facility] ENDOSCOPY;  Service: Endoscopy;  Laterality: N/A;    Prior to Admission medications   Medication Sig Start Date End Date Taking? Authorizing Provider  HYDROcodone-acetaminophen (NORCO) 5-325 MG tablet Take 1 tablet by mouth every 6 (six) hours as needed for moderate pain. 12/30/16   Johnn Hai, PA-C  hydrOXYzine (ATARAX/VISTARIL) 25 MG tablet Take 1 tablet (25 mg total) by mouth every 6 (six) hours as needed for itching. 12/30/16   Johnn Hai, PA-C    Allergies Patient has no known allergies.  Family History  Problem Relation Age of Onset  . Kidney disease Maternal Grandfather     Social History Social History  Substance Use Topics  . Smoking status: Never Smoker  . Smokeless tobacco: Never Used  . Alcohol use No    Review of Systems Constitutional: No fever/chills Eyes: No visual changes. ENT: Positive sore throat. Cardiovascular: Denies chest pain. Respiratory: Denies shortness of breath. Gastrointestinal: No abdominal pain.  No nausea, no vomiting.  No diarrhea.   Genitourinary: Negative for dysuria. Musculoskeletal: Positive for body aches. Skin: Positive for rash. Neurological: Negative for headaches, focal weakness or numbness. ____________________________________________   PHYSICAL EXAM:  VITAL SIGNS: ED Triage Vitals   Enc Vitals Group     BP (!) 115/92     Pulse Rate 77     Resp 18     Temp 99.2 F (37.3 C)     Temp Source Oral     SpO2 97 %     Weight 226 lb (102.5 kg)     Height 5\' 9"  (1.753 m)     Head Circumference      Peak Flow      Pain Score 10     Pain Loc      Pain Edu?      Excl. in [place]?    Constitutional: Alert and oriented. Well appearing and in no acute distress. Eyes: Conjunctivae are normal.  Head: Atraumatic. Nose: No congestion/rhinnorhea. Mouth/Throat: Mucous membranes are moist.  Oropharynx Erythematous without exudate. There are erythematous small papules noted on the soft palate. Uvula is midline. Patient is having no difficulty swallowing his secretions. No difficulty with talking. Neck: No stridor.   Hematological/Lymphatic/Immunilogical: No cervical lymphadenopathy. Cardiovascular: Normal rate, regular rhythm. Grossly normal heart sounds.  Good peripheral circulation. Respiratory: Normal respiratory effort.  No retractions. Lungs CTAB. Musculoskeletal: Moves upper and lower extremities without any difficulty. Neurologic:  Normal speech and language.  No gross focal neurologic deficits are appreciated. No gait instability. Skin:  Skin is warm, dry. There are multiple vesicular areas on the hands and dorsal aspect of the feet. Patient does not have any other involvement on the trunk or extremities. Psychiatric: Mood and affect are normal. Speech and behavior are normal.  ____________________________________________   LABS (all labs ordered are listed, but only abnormal results are displayed)  Labs Reviewed  CBC WITH DIFFERENTIAL/PLATELET - Abnormal; Notable for the following:       Result Value   Neutro Abs 6.7 (*)    Monocytes Absolute 1.1 (*)    All other components within normal limits  COMPREHENSIVE METABOLIC PANEL - Abnormal; Notable for the following:    CO2 21 (*)    Calcium 8.6 (*)    AST 52 (*)    Total Bilirubin 1.4 (*)    All other components within normal limits  POCT RAPID STREP A    PROCEDURES  Procedure(s) performed: None  Procedures  Critical Care performed: No  ____________________________________________   INITIAL IMPRESSION / ASSESSMENT AND PLAN / ED COURSE  Pertinent labs & imaging results that were available during my care of the patient were reviewed by me and considered in my medical decision making (see chart for details).  Patient was given Atarax 25 mg every 6 hours as needed for itching and Norco if needed for pain. Patient states that he was able to sleep last evening without any discomfort. He feels as if this is getting worse today and fears that he will be unable to sleep tonight because of pain. He will follow-up with [place] for further testing. Today's lab work is reassuring that this is not a bacterial infection. Patient was told that we have been seeing quite a bit of pediatric hand-foot-and-mouth disease and this is a possibility. This is a viral illness and will need to run its course. Patient is encouraged to call [facility] where he is currently a patient for  further evaluation.   ____________________________________________   FINAL CLINICAL IMPRESSION(S) / ED DIAGNOSES  Final diagnoses:  Rash and nonspecific skin eruption  Hand, foot and mouth disease      NEW MEDICATIONS STARTED DURING THIS VISIT:  Discharge Medication List as of 12/30/2016  1:28 PM    START taking these medications   Details  HYDROcodone-acetaminophen (NORCO) 5-325 MG tablet Take 1 tablet by mouth every 6 (six) hours as needed for moderate pain., Starting Sun 12/30/2016, Print    hydrOXYzine (ATARAX/VISTARIL) 25 MG tablet Take 1 tablet (25 mg total) by mouth every 6 (six) hours as needed for itching., Starting Sun 12/30/2016, Print         Note:  This document was prepared using Dragon voice recognition software and may include unintentional dictation errors.    Johnn Hai, PA-C 12/30/16 1546    Nance Pear, MD 01/05/17 [phone]

## 2016-12-30 NOTE — Discharge Instructions (Signed)
Increase fluids if unable to eat solid food due to throat pain. Try soft foods such as ice cream or yogurt. Tylenol or ibuprofen as needed for fever or body aches. Norco if needed for pain every 6 hours. Atarax 25 mg every 6 hours as needed for itching. Did not take these medications and drive. Follow-up with [place] if any continued problems with your rash. Hand-foot-and-mouth disease is self-limiting and should go away without any further complications.

## 2016-12-30 NOTE — ED Triage Notes (Signed)
Pt arrives via POV with rash to bilateral hands and feet, sore throat X 2 days. Pt alert and oriented X4, active, cooperative, pt in NAD. RR even and unlabored, color WNL.

## 2017-03-27 ENCOUNTER — Encounter: Payer: Self-pay | Admitting: Family Medicine

## 2017-03-27 ENCOUNTER — Ambulatory Visit: Payer: PRIVATE HEALTH INSURANCE | Admitting: Family Medicine

## 2017-03-27 ENCOUNTER — Other Ambulatory Visit: Payer: Self-pay | Admitting: Family Medicine

## 2017-03-27 VITALS — BP 112/58 | HR 68 | Temp 98.1°F | Resp 16 | Ht 69.0 in | Wt 230.8 lb

## 2017-03-27 DIAGNOSIS — E669 Obesity, unspecified: Secondary | ICD-10-CM

## 2017-03-27 DIAGNOSIS — Z23 Encounter for immunization: Secondary | ICD-10-CM

## 2017-03-27 DIAGNOSIS — R7303 Prediabetes: Secondary | ICD-10-CM

## 2017-03-27 DIAGNOSIS — Z125 Encounter for screening for malignant neoplasm of prostate: Secondary | ICD-10-CM

## 2017-03-27 DIAGNOSIS — Z0000 Encounter for general adult medical examination without abnormal findings: Secondary | ICD-10-CM

## 2017-03-27 DIAGNOSIS — E7800 Pure hypercholesterolemia, unspecified: Secondary | ICD-10-CM

## 2017-03-27 LAB — POCT GLYCOSYLATED HEMOGLOBIN (HGB A1C): Hemoglobin A1C: 5.7 — AB (ref ?–5.6)

## 2017-03-27 NOTE — Progress Notes (Signed)
Subjective:    Patient ID: [name], male    DOB: 11-28-1960, 56 y.o.   MRN: [id number]  Imre Vecchione is a 56 y.o. male presenting on 03/27/2017 for prediabetes   HPI   Pre-Diabetes / Obesity BMI >34 Pleased with improved A1c to 5.7, from 6.1, he is surprised. CBGs: Does not check Meds: Never on meds Currently not on ACE/ARB Lifestyle: - Diet (Dramatic improved diet, completely quit soft drinks, now drinks mostly water)  - Exercise (Reduced portion size for weekly meals, still eats some foods not ideal he admits but reduced portion size, not going to [facility] gym as much but starting to gradually resume now) - Weight gain 4 lbs. Had prior loss Denies hypoglycemia, polyuria, visual changes, numbness or tingling.  Additional history - History of [facility] ED visit 12/30/16 with rash on hands and feet, dx with hand-foot-mouth and sent back to [facility], ultimately he described pustules, not involving mouth and unclear diagnosis, it eventually resolved. They could not give him clear dx, he did lose a few fingernails  Health Maintenance: - Due for Flu Shot, will receive today  Depression screen [facility] 2/9 03/27/2017 12/19/2015 08/29/2015  Decreased Interest 0 0 0  Down, Depressed, Hopeless 0 0 0  PHQ - 2 Score 0 0 0    Social History   Tobacco Use  . Smoking status: Never Smoker  . Smokeless tobacco: Never Used  Substance Use Topics  . Alcohol use: No  . Drug use: No    Review of Systems Per HPI unless specifically indicated above     Objective:    BP (!) 112/58   Pulse 68   Temp 98.1 F (36.7 C) (Oral)   Resp 16   Ht 5\' 9"  (1.753 m)   Wt 230 lb 12.8 oz (104.7 kg)   BMI 34.08 kg/m   Wt Readings from Last 3 Encounters:  03/27/17 230 lb 12.8 oz (104.7 kg)  12/30/16 226 lb (102.5 kg)  12/18/16 226 lb (102.5 kg)    Physical Exam  Constitutional: He is oriented to person, place, and time. He appears well-developed and well-nourished. No distress.    Well-appearing, comfortable, cooperative  HENT:  Head: Normocephalic and atraumatic.  Mouth/Throat: Oropharynx is clear and moist.  Eyes: Conjunctivae are normal. Right eye exhibits no discharge. Left eye exhibits no discharge.  Cardiovascular: Normal rate, regular rhythm, normal heart sounds and intact distal pulses.  No murmur heard. Pulmonary/Chest: Effort normal and breath sounds normal. No respiratory distress. He has no wheezes. He has no rales.  Musculoskeletal: He exhibits no edema.  Neurological: He is alert and oriented to person, place, and time.  Skin: Skin is warm and dry. No rash noted. He is not diaphoretic. No erythema.  Psychiatric: He has a normal mood and affect. His behavior is normal.  Well groomed, good eye contact, normal speech and thoughts  Nursing note and vitals reviewed.  Results for orders placed or performed in visit on 03/27/17  POCT HgB A1C  Result Value Ref Range   Hemoglobin A1C 5.7 (A) 5.6   Recent Labs    06/18/16 0001 12/18/16 0827 03/27/17 0846  HGBA1C 5.2 6.1* 5.7*       Assessment & Plan:   Problem List Items Addressed This Visit    Pre-diabetes - Primary   Relevant Orders   POCT HgB A1C (Completed)    Other Visit Diagnoses    Needs flu shot  Relevant Orders   Flu Vaccine QUAD 36+ mos IM      Meds ordered this encounter  Medications  . triamcinolone ointment (KENALOG) 0.1 %    Sig: APPLY TOPICALLY DAILY FOR 14 DAYS. DO NOT USE ON FACE.    Refill:  0   Follow up plan: Return in about 3 months (around 06/27/2017) for Annual Physical.  Future labs ordered 05/2017  Nobie Putnam, [facility] 03/27/2017, 8:57 AM

## 2017-03-27 NOTE — Assessment & Plan Note (Addendum)
Well-controlled Pre-DM with A1c 5.7 down from 6.1 with lifestyle change Concern with obesity, HLD  Plan:  1. Not on any therapy currently  - remain off 2. Encourage improved lifestyle - low carb, low sugar diet, reduce portion size, continue improving regular exercise 3. Follow-up 3 months annual A1c then space out visits q 6 mo if improved

## 2017-03-27 NOTE — Patient Instructions (Addendum)
Thank you for coming to the clinic today.  1. Keep up the good work overall and keep working on diet.  No medicine for sugar  2.   Flu shot today   3.  DUE for FASTING BLOOD WORK (no food or drink after midnight before the lab appointment, only water or coffee without cream/sugar on the morning of)  SCHEDULE "Lab Only" visit in the morning at the clinic for lab draw in 3 MONTHS   - Make sure Lab Only appointment is at about 1 week before your next appointment, so that results will be available  For Lab Results, once available within 2-3 days of blood draw, you can can log in to MyChart online to view your results and a brief explanation. Also, we can discuss results at next follow-up visit.  Please schedule a Follow-up Appointment to: Return in about 3 months (around 06/27/2017) for Annual Physical.  If you have any other questions or concerns, please feel free to call the clinic or send a message through [place]. You may also schedule an earlier appointment if necessary.  Additionally, you may be receiving a survey about your experience at our clinic within a few days to 1 week by e-mail or mail. We value your feedback.  Nobie Putnam, DO [place]

## 2017-05-02 IMAGING — CT CT ABD-PELV W/ CM
2 of 5 series · 16 of 46 positions shown, 18 images · IV contrast (iopamidol)
Comparison: None.

CLINICAL DATA: Left lower quadrant pain beginning yesterday.
Initial encounter.

EXAM:
CT ABDOMEN AND PELVIS WITH CONTRAST
TECHNIQUE: Multidetector CT imaging of the abdomen and pelvis was performed
using the standard protocol following bolus administration of
intravenous contrast.
CONTRAST:  100 ml [id number] IOPAMIDOL ([id number]) INJECTION 61%

[Series 2: routine abd pel with · axial · 0.85mm/px · z∈[-555,-95]mm · 13 of 104 slices shown, 15 images]
[im 6/104  soft-tissue]
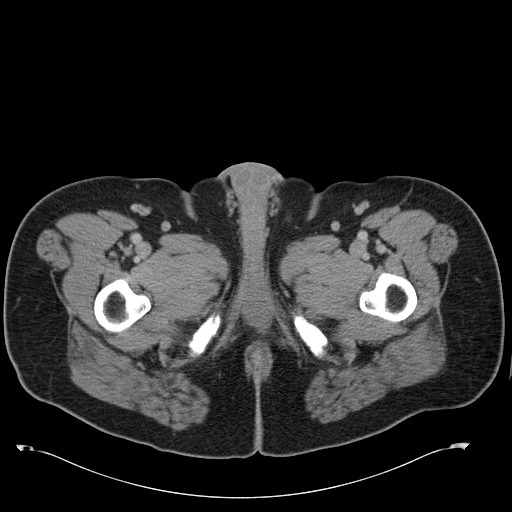
[im 6/104  bone]
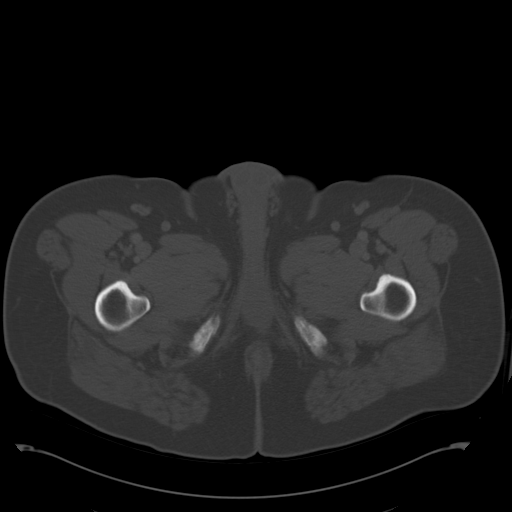
[im 12/104  soft-tissue]
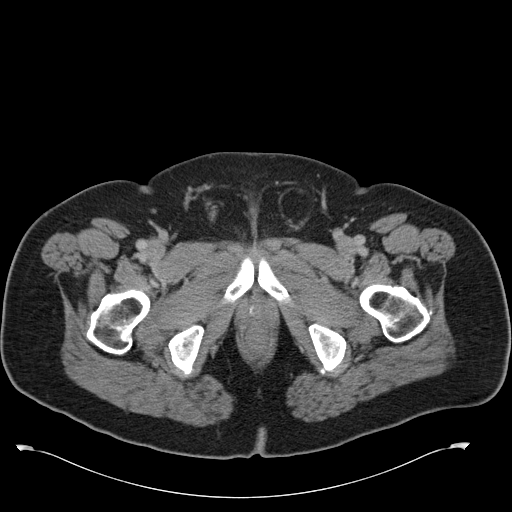
[im 23/104  soft-tissue]
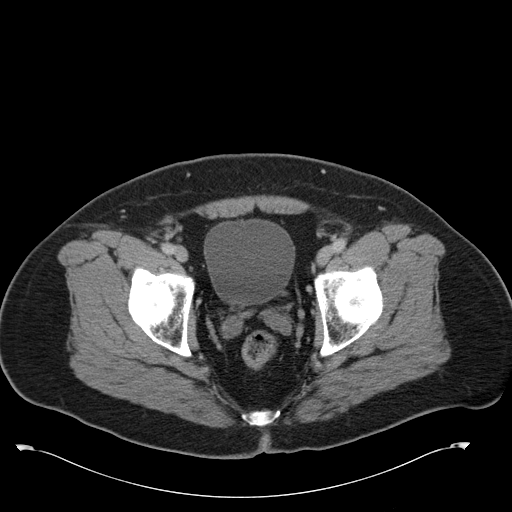
[im 29/104  soft-tissue]
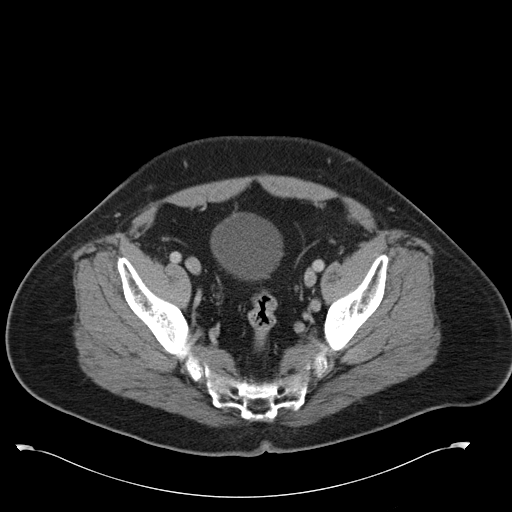
[im 35/104  soft-tissue]
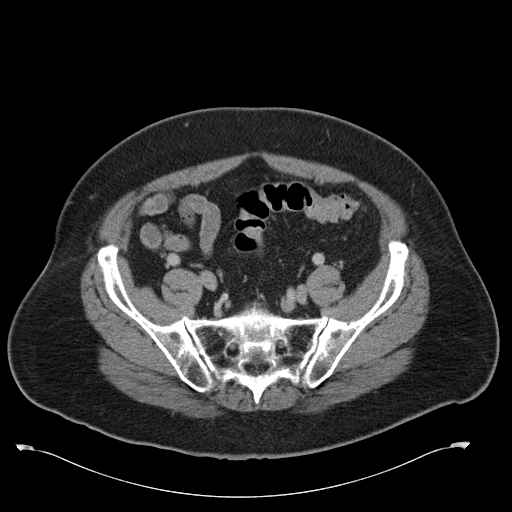
[im 46/104  soft-tissue]
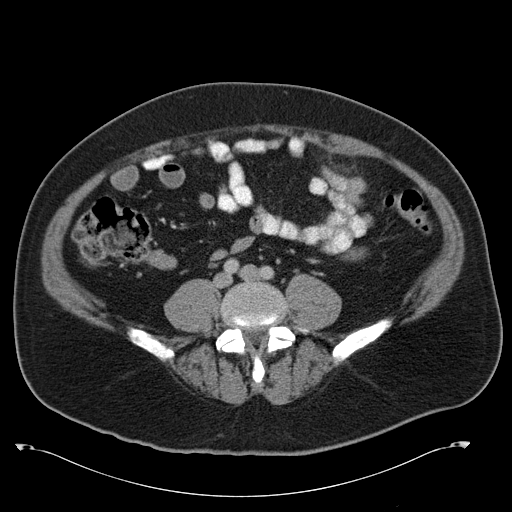
[im 52/104  soft-tissue]
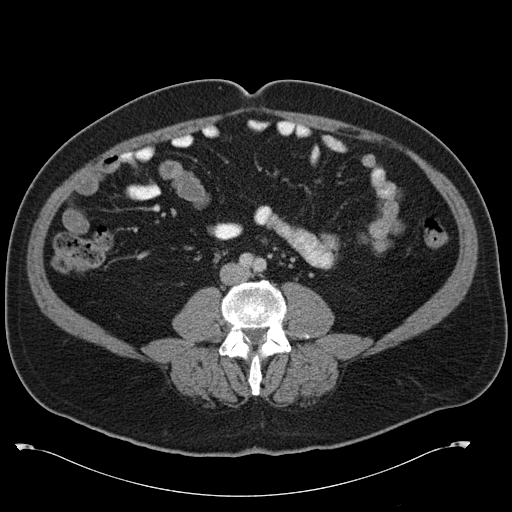
[im 58/104  soft-tissue]
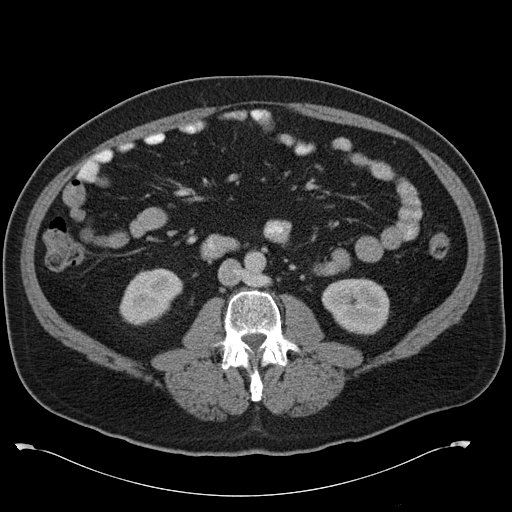
[im 69/104  soft-tissue]
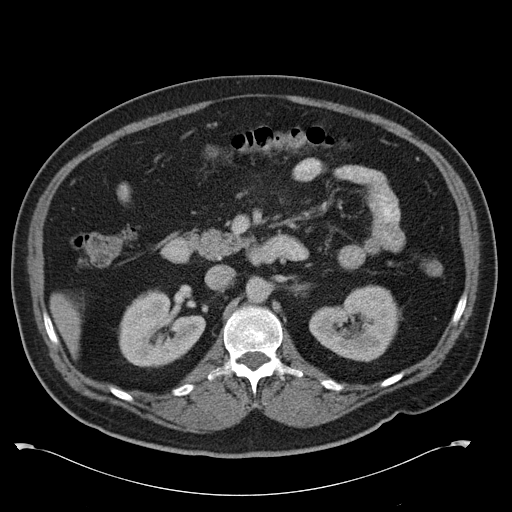
[im 69/104  bone]
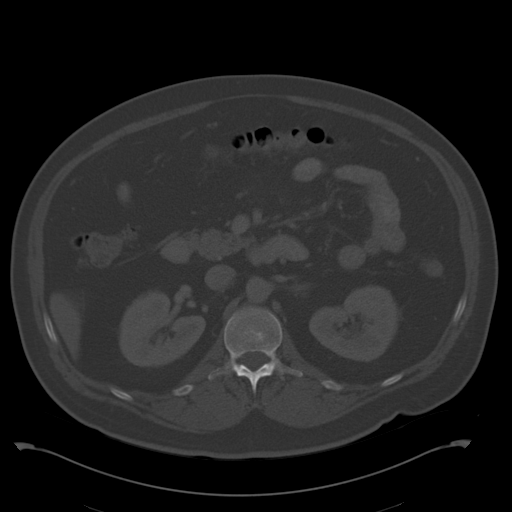
[im 75/104  soft-tissue]
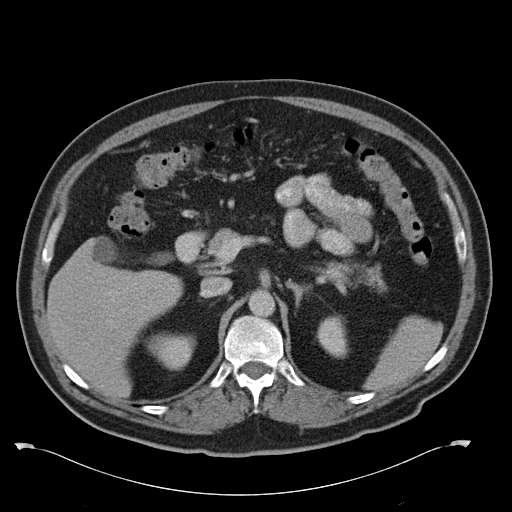
[im 81/104  soft-tissue]
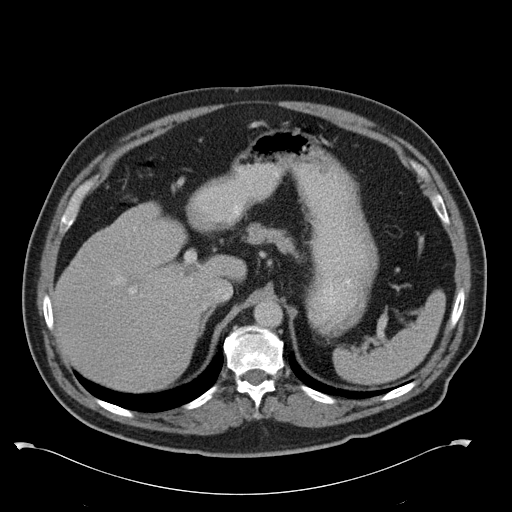
[im 92/104  soft-tissue]
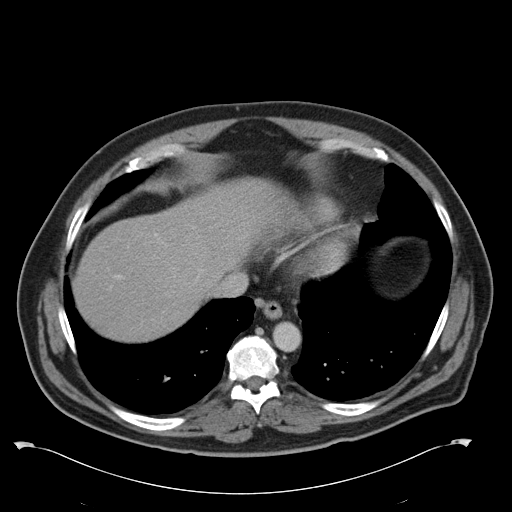
[im 98/104  soft-tissue]
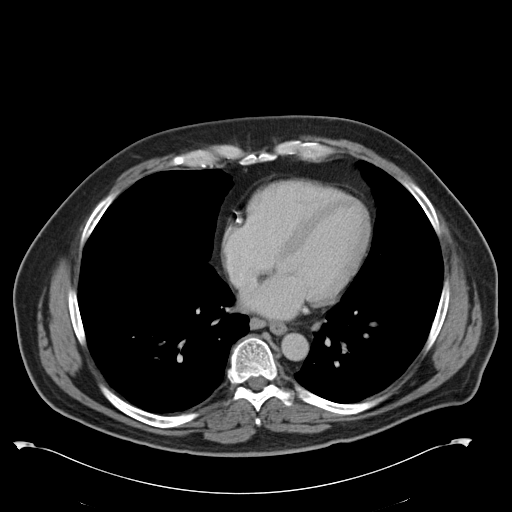

[Series 6: cor routine abd pel with · coronal · 0.85mm/px · 3 of 158 slices shown]
[im 53/158  soft-tissue]
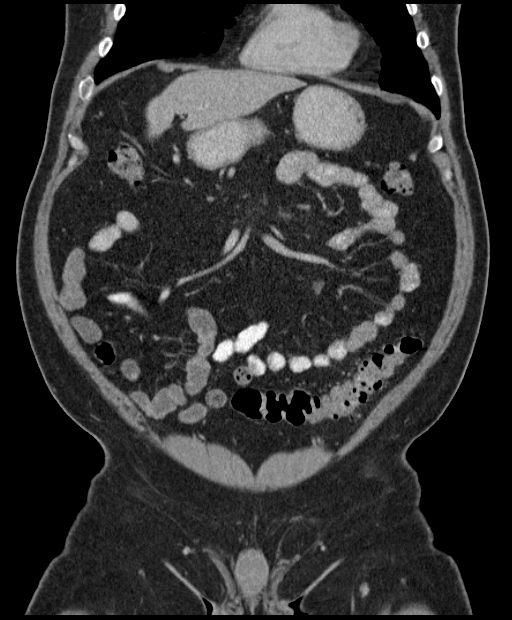
[im 70/158  soft-tissue]
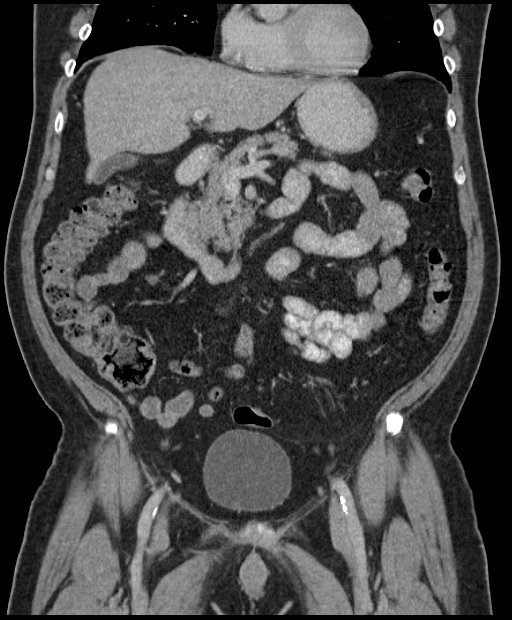
[im 88/158  soft-tissue]
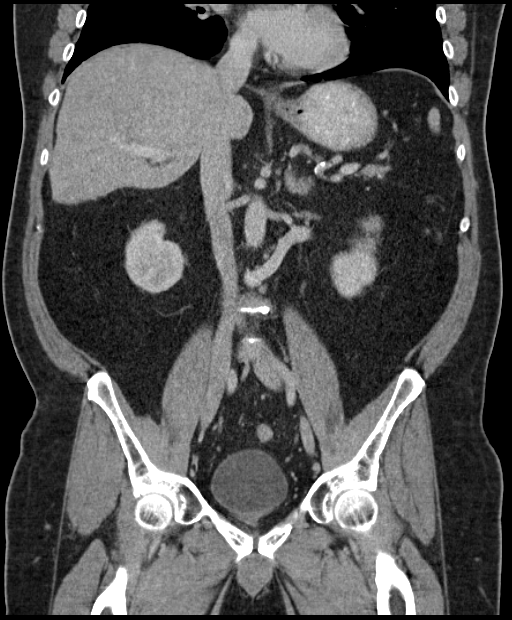

[16 of 46 positions shown; findings below may reference images not displayed]

FINDINGS: Two nodules are seen in the right middle lobe on image 1 measuring
0.8 and 0.3 cm. The lung bases are otherwise clear. Left infrahilar
lymph node on image 3 measures 1.2 cm short axis dimension. 1 cm
right paraesophageal lymph node is seen on image 10. Calcific
coronary artery disease is noted.

The spleen, adrenal glands, gallbladder, liver, pancreas and kidneys
are unremarkable. Retroaortic left renal vein is noted.

Distal colonic diverticulosis is identified. Straightening and fat
is seen at the junction of the distal descending and sigmoid colon
which appears to surround a lobule fat rather than a diverticulum.
The appendix is not visualized but no pericecal inflammatory process
is identified. Stomach and small bowel appear normal. No
lymphadenopathy is seen within the abdomen or pelvis. Prostate gland
and urinary bladder appear normal. Fat containing left inguinal
hernia is noted.

No focal bony abnormality is identified.
IMPRESSION: Pericolonic inflammatory change at the junction of the distal
descending and sigmoid colon is favored to represent epiploica
appendagitis rather than diverticulitis.

Mediastinal lymphadenopathy in the lower chest is partially
visualized. Chest CT with contrast is recommended for further
evaluation.

## 2017-06-06 ENCOUNTER — Other Ambulatory Visit: Payer: PRIVATE HEALTH INSURANCE

## 2017-06-06 DIAGNOSIS — E669 Obesity, unspecified: Secondary | ICD-10-CM

## 2017-06-06 DIAGNOSIS — Z125 Encounter for screening for malignant neoplasm of prostate: Secondary | ICD-10-CM

## 2017-06-06 DIAGNOSIS — E7800 Pure hypercholesterolemia, unspecified: Secondary | ICD-10-CM

## 2017-06-06 DIAGNOSIS — R7303 Prediabetes: Secondary | ICD-10-CM

## 2017-06-06 DIAGNOSIS — Z0000 Encounter for general adult medical examination without abnormal findings: Secondary | ICD-10-CM

## 2017-06-07 LAB — CBC WITH DIFFERENTIAL/PLATELET
BASOS ABS: 61 {cells}/uL (ref 0–200)
BASOS PCT: 1 %
EOS PCT: 2 %
Eosinophils Absolute: 122 cells/uL (ref 15–500)
HCT: 43.9 % (ref 38.5–50.0)
HEMOGLOBIN: 15.6 g/dL (ref 13.2–17.1)
LYMPHS ABS: 2525 {cells}/uL (ref 850–3900)
MCH: 32.2 pg (ref 27.0–33.0)
MCHC: 35.5 g/dL (ref 32.0–36.0)
MCV: 90.7 fL (ref 80.0–100.0)
MONOS PCT: 11.2 %
MPV: 10.9 fL (ref 7.5–12.5)
NEUTROS ABS: 2708 {cells}/uL (ref 1500–7800)
Neutrophils Relative %: 44.4 %
Platelets: 257 10*3/uL (ref 140–400)
RBC: 4.84 10*6/uL (ref 4.20–5.80)
RDW: 12.1 % (ref 11.0–15.0)
Total Lymphocyte: 41.4 %
WBC mixed population: 683 cells/uL (ref 200–950)
WBC: 6.1 10*3/uL (ref 3.8–10.8)

## 2017-06-07 LAB — COMPLETE METABOLIC PANEL WITH GFR
AG RATIO: 1.4 (calc) (ref 1.0–2.5)
ALKALINE PHOSPHATASE (APISO): 52 U/L (ref 40–115)
ALT: 41 U/L (ref 9–46)
AST: 38 U/L — AB (ref 10–35)
Albumin: 3.9 g/dL (ref 3.6–5.1)
BILIRUBIN TOTAL: 0.9 mg/dL (ref 0.2–1.2)
BUN: 14 mg/dL (ref 7–25)
CALCIUM: 8.8 mg/dL (ref 8.6–10.3)
CHLORIDE: 105 mmol/L (ref 98–110)
CO2: 25 mmol/L (ref 20–32)
Creat: 0.92 mg/dL (ref 0.70–1.33)
GFR, EST NON AFRICAN AMERICAN: 91 mL/min/{1.73_m2} (ref >=60)
GFR, Est African American: 106 mL/min/{1.73_m2} (ref >=60)
GLOBULIN: 2.8 g/dL (ref 1.9–3.7)
Glucose, Bld: 98 mg/dL (ref 65–99)
POTASSIUM: 3.6 mmol/L (ref 3.5–5.3)
SODIUM: 139 mmol/L (ref 135–146)
Total Protein: 6.7 g/dL (ref 6.1–8.1)

## 2017-06-07 LAB — LIPID PANEL
CHOL/HDL RATIO: 4.4 (calc) (ref <5.0)
Cholesterol: 133 mg/dL (ref <200)
HDL: 30 mg/dL — ABNORMAL LOW (ref >40)
LDL CHOLESTEROL (CALC): 84 mg/dL
NON-HDL CHOLESTEROL (CALC): 103 mg/dL (ref <130)
Triglycerides: 101 mg/dL (ref <150)

## 2017-06-07 LAB — HEMOGLOBIN A1C
EAG (MMOL/L): 6.6 (calc)
HEMOGLOBIN A1C: 5.8 %{Hb} — AB (ref <5.7)
MEAN PLASMA GLUCOSE: 120 (calc)

## 2017-06-07 LAB — PSA, TOTAL WITH REFLEX TO PSA, FREE: PSA, Total: 0.4 ng/mL (ref <=4.0)

## 2017-06-13 ENCOUNTER — Encounter: Payer: Self-pay | Admitting: Family Medicine

## 2017-06-13 ENCOUNTER — Ambulatory Visit (INDEPENDENT_AMBULATORY_CARE_PROVIDER_SITE_OTHER): Payer: PRIVATE HEALTH INSURANCE | Admitting: Family Medicine

## 2017-06-13 VITALS — BP 108/71 | HR 73 | Temp 98.6°F | Resp 16 | Ht 69.0 in | Wt 226.0 lb

## 2017-06-13 DIAGNOSIS — E7800 Pure hypercholesterolemia, unspecified: Secondary | ICD-10-CM | POA: Diagnosis not present

## 2017-06-13 DIAGNOSIS — Z0000 Encounter for general adult medical examination without abnormal findings: Secondary | ICD-10-CM | POA: Diagnosis not present

## 2017-06-13 DIAGNOSIS — E669 Obesity, unspecified: Secondary | ICD-10-CM | POA: Diagnosis not present

## 2017-06-13 DIAGNOSIS — Z125 Encounter for screening for malignant neoplasm of prostate: Secondary | ICD-10-CM | POA: Insufficient documentation

## 2017-06-13 DIAGNOSIS — R7303 Prediabetes: Secondary | ICD-10-CM

## 2017-06-13 DIAGNOSIS — E786 Lipoprotein deficiency: Secondary | ICD-10-CM | POA: Diagnosis not present

## 2017-06-13 NOTE — Assessment & Plan Note (Signed)
Still improved wt down 5 lbs in 3 months Stable A1c - Emphasized continue diet/exercise program

## 2017-06-13 NOTE — Assessment & Plan Note (Signed)
Well-controlled Pre-DM with A1c 5.8 stable from 5.7. Still improved lifestyle Concern with obesity, HLD  Plan:  1. Not on any therapy currently  - remain off 2. Encourage improved lifestyle - low carb, low sugar diet, reduce portion size, continue improving regular exercise 3. Follow-up 6 months POC A1c

## 2017-06-13 NOTE — Assessment & Plan Note (Signed)
Controlled cholesterol on except low HDL, on lifestyle, not statin Last lipid panel 05/2017 Calculated ASCVD 10 yr risk score 6.5%  Plan: 1. Discussion on ASCVD risk - reviewed indications for ASA / Statin - defer for now 2. Encourage improved lifestyle - low carb/cholesterol, reduce portion size, continue improving regular exercise 3. Lipids yearly

## 2017-06-13 NOTE — Assessment & Plan Note (Signed)
Low risk patient, with prior reported negative screening - Last PSA 0.4, prior values normal - Last DRE few years ago reported normal - Clinically asymptomatic  Plan: 1. Reviewed prostate cancer screening guidelines and risks including potential prostate biopsy if abnormal PSA, proceed with yearly PSA for now, and anticipate DRE as needed especially if abnormal PSA or new symptoms

## 2017-06-13 NOTE — Progress Notes (Signed)
Subjective:    Patient ID: Alexander Atkinson, male    DOB: [birth year], 59 y.o.   MRN: [id number]  Alexander Atkinson is a 57 y.o. male presenting on 06/13/2017 for Annual Exam   HPI   Here for Annual Physical and Lab Review  Followed by Dermatology  Pre-Diabetes/ Obesity BMI >34 Recent A1c trend 5.8 from 5.7. He reports doing well thinks his sugar should have been better. CBGs:Does not check Meds: Never on meds Currentlynot on ACE/ARB Lifestyle: - Diet (Still improved diet same as before, no soft drinks, more water) - Exercise (He averages [employer] gym 2-3x week) Weight down 5 lbs in 3 months - additionally recent had food illness, nausea vomiting, food poisoning, poor PO and diarrhea for few days, then felt better 4-5 days, now resolved  Family history of Thyroid Disease Reports his mother had hyperthyroid and then had hypothyroid for most of her life on thyroid replacement. Also he has 2 older brothers age 92 and 52 both have thyroid problem, he thinks hypothyroidism also on medicine, he has had TSH in past that was normal in 2017.   Health Maintenance: UTD Flu Shot  Prostate CA Screening: Prior DRE reported normal (years ago Dr Luan Pulling Sr). Last PSA 0.4 (05/2017). Currently asymptomatic w/o BPH LUTS. No known family history of prostate CA.   Depression screen [facility] 2/9 06/13/2017 03/27/2017 12/19/2015  Decreased Interest 0 0 0  Down, Depressed, Hopeless 0 0 0  PHQ - 2 Score 0 0 0    History reviewed. No pertinent past medical history. Past Surgical History:  Procedure Laterality Date  . APPENDECTOMY    . COLONOSCOPY WITH PROPOFOL N/A 11/22/2015   Procedure: COLONOSCOPY WITH PROPOFOL;  Surgeon: [name], MD;  Location: [facility] ENDOSCOPY;  Service: Endoscopy;  Laterality: N/A;   Social History   Socioeconomic History  . Marital status: Single    Spouse name: Not on file  . Number of children: Not on file  . Years of education: Not on file  . Highest education level:  Not on file  Social Needs  . Financial resource strain: Not on file  . Food insecurity - worry: Not on file  . Food insecurity - inability: Not on file  . Transportation needs - medical: Not on file  . Transportation needs - non-medical: Not on file  Occupational History  . Not on file  Tobacco Use  . Smoking status: Never Smoker  . Smokeless tobacco: Never Used  Substance and Sexual Activity  . Alcohol use: No  . Drug use: No  . Sexual activity: Not on file  Other Topics Concern  . Not on file  Social History Narrative  . Not on file   Family History  Problem Relation Age of Onset  . Thyroid disease Mother   . Kidney disease Maternal Grandfather   . Hypothyroidism Brother   . Hypothyroidism Brother    No current outpatient medications on file prior to visit.   No current facility-administered medications on file prior to visit.     Review of Systems  Constitutional: Negative for activity change, appetite change, chills, diaphoresis, fatigue and fever.  HENT: Negative for congestion and hearing loss.   Eyes: Negative for visual disturbance.  Respiratory: Negative for apnea, cough, choking, chest tightness, shortness of breath and wheezing.   Cardiovascular: Negative for chest pain, palpitations and leg swelling.  Gastrointestinal: Negative for abdominal pain, anal bleeding, blood in stool, constipation, diarrhea, nausea and vomiting.  Endocrine: Negative for cold  intolerance and polyuria.  Genitourinary: Negative for decreased urine volume, difficulty urinating, dysuria, frequency, hematuria, scrotal swelling, testicular pain and urgency.  Musculoskeletal: Negative for arthralgias (occasional R index finger discomfort MCP joint) and neck pain.  Skin: Negative for rash.  Allergic/Immunologic: Negative for environmental allergies.  Neurological: Negative for dizziness, weakness, light-headedness, numbness and headaches.  Hematological: Negative for adenopathy.    Psychiatric/Behavioral: Negative for behavioral problems, dysphoric mood and sleep disturbance. The patient is not nervous/anxious.    Per HPI unless specifically indicated above     Objective:    BP 108/71   Pulse 73   Temp 98.6 F (37 C) (Oral)   Resp 16   Ht 5\' 9"  (1.753 m)   Wt 226 lb (102.5 kg)   BMI 33.37 kg/m    Wt Readings from Last 3 Encounters:  06/13/17 226 lb (102.5 kg)  03/27/17 230 lb 12.8 oz (104.7 kg)  12/30/16 226 lb (102.5 kg)    Physical Exam  Constitutional: He is oriented to person, place, and time. He appears well-developed and well-nourished. No distress.  Well-appearing, comfortable, cooperative  HENT:  Head: Normocephalic and atraumatic.  Mouth/Throat: Oropharynx is clear and moist.  Frontal / maxillary sinuses non-tender. Nares patent without purulence or edema. Bilateral TMs clear without erythema, effusion or bulging. Oropharynx clear without erythema, exudates, edema or asymmetry.  Eyes: Conjunctivae and EOM are normal. Pupils are equal, round, and reactive to light. Right eye exhibits no discharge. Left eye exhibits no discharge.  Neck: Normal range of motion. Neck supple. No thyromegaly present.  Cardiovascular: Normal rate, regular rhythm, normal heart sounds and intact distal pulses.  No murmur heard. Pulmonary/Chest: Effort normal and breath sounds normal. No respiratory distress. He has no wheezes. He has no rales.  Abdominal: Soft. Bowel sounds are normal. He exhibits no distension and no mass. There is no tenderness.  Musculoskeletal: Normal range of motion. He exhibits no edema or tenderness.  Upper / Lower Extremities: - Normal muscle tone, strength bilateral upper extremities 5/5, lower extremities 5/5  Lymphadenopathy:    He has no cervical adenopathy.  Neurological: He is alert and oriented to person, place, and time.  Distal sensation intact to light touch all extremities  Skin: Skin is warm and dry. No rash noted. He is not  diaphoretic. No erythema.  Psychiatric: He has a normal mood and affect. His behavior is normal.  Well groomed, good eye contact, normal speech and thoughts  Nursing note and vitals reviewed.  Results for orders placed or performed in visit on 06/06/17  Lipid panel  Result Value Ref Range   Cholesterol 133 <200 mg/dL   HDL 30 (L) >40 mg/dL   Triglycerides 101 <150 mg/dL   LDL Cholesterol (Calc) 84 mg/dL (calc)   Total CHOL/HDL Ratio 4.4 <5.0 (calc)   Non-HDL Cholesterol (Calc) 103 <130 mg/dL (calc)  CBC with Differential/Platelet  Result Value Ref Range   WBC 6.1 3.8 - 10.8 Thousand/uL   RBC 4.84 4.20 - 5.80 Million/uL   Hemoglobin 15.6 13.2 - 17.1 g/dL   HCT 43.9 38.5 - 50.0 %   MCV 90.7 80.0 - 100.0 fL   MCH 32.2 27.0 - 33.0 pg   MCHC 35.5 32.0 - 36.0 g/dL   RDW 12.1 11.0 - 15.0 %   Platelets 257 140 - 400 Thousand/uL   MPV 10.9 7.5 - 12.5 fL   Neutro Abs 2,708 1,500 - 7,800 cells/uL   Lymphs Abs 2,525 850 - 3,900 cells/uL   WBC mixed  population 683 200 - 950 cells/uL   Eosinophils Absolute 122 15 - 500 cells/uL   Basophils Absolute 61 0 - 200 cells/uL   Neutrophils Relative % 44.4 %   Total Lymphocyte 41.4 %   Monocytes Relative 11.2 %   Eosinophils Relative 2.0 %   Basophils Relative 1.0 %  Hemoglobin A1c  Result Value Ref Range   Hgb A1c MFr Bld 5.8 (H) <5.7 % of total Hgb   Mean Plasma Glucose 120 (calc)   eAG (mmol/L) 6.6 (calc)  COMPLETE METABOLIC PANEL WITH GFR  Result Value Ref Range   Glucose, Bld 98 65 - 99 mg/dL   BUN 14 7 - 25 mg/dL   Creat 0.92 0.70 - 1.33 mg/dL   GFR, Est Non African American 91 > OR = 60 mL/min/1.74m2   GFR, Est African American 106 > OR = 60 mL/min/1.63m2   BUN/Creatinine Ratio NOT APPLICABLE 6 - 22 (calc)   Sodium 139 135 - 146 mmol/L   Potassium 3.6 3.5 - 5.3 mmol/L   Chloride 105 98 - 110 mmol/L   CO2 25 20 - 32 mmol/L   Calcium 8.8 8.6 - 10.3 mg/dL   Total Protein 6.7 6.1 - 8.1 g/dL   Albumin 3.9 3.6 - 5.1 g/dL   Globulin  2.8 1.9 - 3.7 g/dL (calc)   AG Ratio 1.4 1.0 - 2.5 (calc)   Total Bilirubin 0.9 0.2 - 1.2 mg/dL   Alkaline phosphatase (APISO) 52 40 - 115 U/L   AST 38 (H) 10 - 35 U/L   ALT 41 9 - 46 U/L  PSA, Total with Reflex to PSA, Free  Result Value Ref Range   PSA, Total 0.4 < OR = 4.0 ng/mL      Assessment & Plan:   Problem List Items Addressed This Visit    Hyperlipidemia   Low HDL (under 40)    Controlled cholesterol on except low HDL, on lifestyle, not statin Last lipid panel 05/2017 Calculated ASCVD 10 yr risk score 6.5%  Plan: 1. Discussion on ASCVD risk - reviewed indications for ASA / Statin - defer for now 2. Encourage improved lifestyle - low carb/cholesterol, reduce portion size, continue improving regular exercise 3. Lipids yearly      Obesity (BMI 30.0-34.9)    Still improved wt down 5 lbs in 3 months Stable A1c - Emphasized continue diet/exercise program      Pre-diabetes    Well-controlled Pre-DM with A1c 5.8 stable from 5.7. Still improved lifestyle Concern with obesity, HLD  Plan:  1. Not on any therapy currently  - remain off 2. Encourage improved lifestyle - low carb, low sugar diet, reduce portion size, continue improving regular exercise 3. Follow-up 6 months POC A1c      Screening for prostate cancer    Low risk patient, with prior reported negative screening - Last PSA 0.4, prior values normal - Last DRE few years ago reported normal - Clinically asymptomatic  Plan: 1. Reviewed prostate cancer screening guidelines and risks including potential prostate biopsy if abnormal PSA, proceed with yearly PSA for now, and anticipate DRE as needed especially if abnormal PSA or new symptoms       Other Visit Diagnoses    Annual physical exam    -  Primary  UTD on screening Encouarged healthy lifestyle, wt loss Follow-up yearly labs in 05/2018 - will add TSH / Free T4 for Hypothyroid monitoring screening w/ fam history        No orders of the  defined  types were placed in this encounter.   Follow up plan: Return in about 6 months (around 12/11/2017) for Pre-Diabetes A1c.  Nobie Putnam, [facility] 06/13/2017, 8:38 PM

## 2017-06-13 NOTE — Patient Instructions (Addendum)
Thank you for coming to the office today.  1. Pre-Diabetes stable - A1c 5.8 from last time 5.7, overall still better from 6.1  Keep up the good work with diet and exercise as discussed  Next re-check finger stick A1c in 6 months to get the trend  Liver enzyme test improved, no concern there.  Cholesterol is good except "Good Cholester" HDL is a little low, 30, exercise will improve this  Still do not need to take preventative medicine aspirn or statin yet at this time, maybe in the future  Next yearly labs in 05/2018 with blood work including Thyroid function  Please schedule a Follow-up Appointment to: Return in about 6 months (around 12/11/2017) for Pre-Diabetes A1c.    If you have any other questions or concerns, please feel free to call the office or send a message through [place]. You may also schedule an earlier appointment if necessary.  Additionally, you may be receiving a survey about your experience at our office within a few days to 1 week by e-mail or mail. We value your feedback.  Nobie Putnam, DO No Name

## 2017-12-11 ENCOUNTER — Other Ambulatory Visit: Payer: Self-pay | Admitting: Family Medicine

## 2017-12-11 ENCOUNTER — Encounter: Payer: Self-pay | Admitting: Family Medicine

## 2017-12-11 ENCOUNTER — Ambulatory Visit (INDEPENDENT_AMBULATORY_CARE_PROVIDER_SITE_OTHER): Payer: PRIVATE HEALTH INSURANCE | Admitting: Family Medicine

## 2017-12-11 VITALS — BP 114/78 | HR 73 | Temp 98.1°F | Resp 16 | Ht 69.0 in | Wt 235.0 lb

## 2017-12-11 DIAGNOSIS — E669 Obesity, unspecified: Secondary | ICD-10-CM | POA: Diagnosis not present

## 2017-12-11 DIAGNOSIS — R7303 Prediabetes: Secondary | ICD-10-CM

## 2017-12-11 DIAGNOSIS — E7800 Pure hypercholesterolemia, unspecified: Secondary | ICD-10-CM

## 2017-12-11 DIAGNOSIS — E66811 Obesity, class 1: Secondary | ICD-10-CM

## 2017-12-11 DIAGNOSIS — Z125 Encounter for screening for malignant neoplasm of prostate: Secondary | ICD-10-CM

## 2017-12-11 DIAGNOSIS — Z0000 Encounter for general adult medical examination without abnormal findings: Secondary | ICD-10-CM

## 2017-12-11 LAB — POCT GLYCOSYLATED HEMOGLOBIN (HGB A1C): HEMOGLOBIN A1C: 5.7 % — AB (ref 4.0–5.6)

## 2017-12-11 NOTE — Patient Instructions (Addendum)
Thank you for coming to the office today.  A1c 5.7 - relatively stable - keep up the good work and try to keep improving exercise.  Recent Labs    03/27/17 0846 06/06/17 0805 12/11/17 0835  HGBA1C 5.7* 5.8* 5.7*   Think of my 5 minute rule of exercise - try to get back at it.   DUE for FASTING BLOOD WORK (no food or drink after midnight before the lab appointment, only water or coffee without cream/sugar on the morning of)  SCHEDULE "Lab Only" visit in the morning at the clinic for lab draw in 6 MONTHS   - Make sure Lab Only appointment is at about 1 week before your next appointment, so that results will be available  For Lab Results, once available within 2-3 days of blood draw, you can can log in to MyChart online to view your results and a brief explanation. Also, we can discuss results at next follow-up visit.   Please schedule a Follow-up Appointment to: Return in about 6 months (around 06/13/2018) for Annual Physical.  If you have any other questions or concerns, please feel free to call the office or send a message through [place]. You may also schedule an earlier appointment if necessary.  Additionally, you may be receiving a survey about your experience at our office within a few days to 1 week by e-mail or mail. We value your feedback.  Nobie Putnam, DO [place]

## 2017-12-11 NOTE — Assessment & Plan Note (Signed)
Well-controlled Pre-DM with A1c 5.7 from last time 5.8 Concern with obesity, HLD  Plan:  1. Not on any therapy currently 2. Encourage improved lifestyle - low carb, low sugar diet, reduce portion size, restart regular exercise with swimming at [facility] - goal to resume exercise 3. Follow-up 6 months annual with labs A1c

## 2017-12-11 NOTE — Assessment & Plan Note (Signed)
Weight gain +9 lbs since last visit but only up 5 lbs in a year Improved diet. Now goal to increase and resume regular exercise, swimming at [facility]

## 2017-12-11 NOTE — Progress Notes (Signed)
Subjective:    Patient ID: Alexander Atkinson, male    DOB: [birth year], 57 y.o.   MRN: [id number]  Alexander Atkinson is a 57 y.o. male presenting on 12/11/2017 for prediabetes   HPI   Pre-Diabetes/ Obesity BMI >34 Recent A1c trend 5.8, now down to 5.7, has been relatively stable. He has no new concerns at this time. Trying to improve. CBGs:Does not check Meds: Never on meds Currentlynot on ACE/ARB Lifestyle: - Weight up few lbs, overall stable in 6 months.  - Diet (He has reduced portion size, limiting meals in morning / breakfast, and eating smaller meal for dinner, trying improved, no soft drinks, keeps drinking more water) - Exercise (He has not exercised regularly in >6 months, he plans to resume gym [employer] swimming in future) Weight up 9 lbs in 6 months, but previously only up 4 lbs in 1 year Denies hypoglycemia, polyuria, visual changes, numbness or tingling.   Depression screen [facility] 2/9 12/11/2017 06/13/2017 03/27/2017  Decreased Interest 0 0 0  Down, Depressed, Hopeless 0 0 0  PHQ - 2 Score 0 0 0    Social History   Tobacco Use  . Smoking status: Never Smoker  . Smokeless tobacco: Never Used  Substance Use Topics  . Alcohol use: No  . Drug use: No    Review of Systems Per HPI unless specifically indicated above     Objective:    BP 114/78   Pulse 73   Temp 98.1 F (36.7 C) (Oral)   Resp 16   Ht 5\' 9"  (1.753 m)   Wt 235 lb (106.6 kg)   BMI 34.70 kg/m   Wt Readings from Last 3 Encounters:  12/11/17 235 lb (106.6 kg)  06/13/17 226 lb (102.5 kg)  03/27/17 230 lb 12.8 oz (104.7 kg)    Physical Exam  Constitutional: He is oriented to person, place, and time. He appears well-developed and well-nourished. No distress.  Well-appearing, comfortable, cooperative  HENT:  Head: Normocephalic and atraumatic.  Mouth/Throat: Oropharynx is clear and moist.  Eyes: Conjunctivae are normal. Right eye exhibits no discharge. Left eye exhibits no discharge.  Neck:  Normal range of motion. Neck supple. No thyromegaly present.  Cardiovascular: Normal rate, regular rhythm, normal heart sounds and intact distal pulses.  No murmur heard. Pulmonary/Chest: Effort normal and breath sounds normal. No respiratory distress. He has no wheezes. He has no rales.  Musculoskeletal: Normal range of motion. He exhibits no edema.  Lymphadenopathy:    He has no cervical adenopathy.  Neurological: He is alert and oriented to person, place, and time.  Skin: Skin is warm and dry. No rash noted. He is not diaphoretic. No erythema.  Psychiatric: He has a normal mood and affect. His behavior is normal.  Well groomed, good eye contact, normal speech and thoughts  Nursing note and vitals reviewed.  Results for orders placed or performed in visit on 12/11/17  POCT HgB A1C  Result Value Ref Range   Hemoglobin A1C 5.7 (A) 4.0 - 5.6 %   HbA1c POC (<> result, manual entry)  4.0 - 5.6 %   HbA1c, POC (prediabetic range)  5.7 - 6.4 %   HbA1c, POC (controlled diabetic range)  0.0 - 7.0 %   Recent Labs    03/27/17 0846 06/06/17 0805 12/11/17 0835  HGBA1C 5.7* 5.8* 5.7*       Assessment & Plan:   Problem List Items Addressed This Visit    Obesity (BMI 30.0-34.9)  Weight gain +9 lbs since last visit but only up 5 lbs in a year Improved diet. Now goal to increase and resume regular exercise, swimming at [facility]      Pre-diabetes - Primary    Well-controlled Pre-DM with A1c 5.7 from last time 5.8 Concern with obesity, HLD  Plan:  1. Not on any therapy currently 2. Encourage improved lifestyle - low carb, low sugar diet, reduce portion size, restart regular exercise with swimming at [facility] - goal to resume exercise 3. Follow-up 6 months annual with labs A1c      Relevant Orders   POCT HgB A1C (Completed)      No orders of the defined types were placed in this encounter.   Follow up plan: Return in about 6 months (around 06/13/2018) for Annual Physical.  Future labs  ordered for 06/03/17  Nobie Putnam, [facility] 12/11/2017, 8:57 AM

## 2018-06-03 ENCOUNTER — Other Ambulatory Visit: Payer: PRIVATE HEALTH INSURANCE

## 2018-06-03 DIAGNOSIS — R7303 Prediabetes: Secondary | ICD-10-CM

## 2018-06-03 DIAGNOSIS — Z125 Encounter for screening for malignant neoplasm of prostate: Secondary | ICD-10-CM

## 2018-06-03 DIAGNOSIS — E7800 Pure hypercholesterolemia, unspecified: Secondary | ICD-10-CM

## 2018-06-03 DIAGNOSIS — Z0000 Encounter for general adult medical examination without abnormal findings: Secondary | ICD-10-CM

## 2018-06-05 LAB — LIPID PANEL
CHOL/HDL RATIO: 3.8 (calc) (ref <5.0)
Cholesterol: 187 mg/dL (ref <200)
HDL: 49 mg/dL (ref >40)
LDL CHOLESTEROL (CALC): 121 mg/dL — AB
NON-HDL CHOLESTEROL (CALC): 138 mg/dL — AB (ref <130)
Triglycerides: 75 mg/dL (ref <150)

## 2018-06-05 LAB — CBC WITH DIFFERENTIAL/PLATELET
ABSOLUTE MONOCYTES: 784 {cells}/uL (ref 200–950)
BASOS PCT: 0.9 %
Basophils Absolute: 63 cells/uL (ref 0–200)
Eosinophils Absolute: 210 cells/uL (ref 15–500)
Eosinophils Relative: 3 %
HEMATOCRIT: 43.6 % (ref 38.5–50.0)
HEMOGLOBIN: 15.3 g/dL (ref 13.2–17.1)
LYMPHS ABS: 2842 {cells}/uL (ref 850–3900)
MCH: 33.1 pg — ABNORMAL HIGH (ref 27.0–33.0)
MCHC: 35.1 g/dL (ref 32.0–36.0)
MCV: 94.4 fL (ref 80.0–100.0)
MPV: 10.7 fL (ref 7.5–12.5)
Monocytes Relative: 11.2 %
NEUTROS ABS: 3101 {cells}/uL (ref 1500–7800)
NEUTROS PCT: 44.3 %
Platelets: 271 10*3/uL (ref 140–400)
RBC: 4.62 10*6/uL (ref 4.20–5.80)
RDW: 12.4 % (ref 11.0–15.0)
Total Lymphocyte: 40.6 %
WBC: 7 10*3/uL (ref 3.8–10.8)

## 2018-06-05 LAB — COMPLETE METABOLIC PANEL WITH GFR
AG RATIO: 1.2 (calc) (ref 1.0–2.5)
ALKALINE PHOSPHATASE (APISO): 70 U/L (ref 40–115)
ALT: 21 U/L (ref 9–46)
AST: 18 U/L (ref 10–35)
Albumin: 3.8 g/dL (ref 3.6–5.1)
BILIRUBIN TOTAL: 0.8 mg/dL (ref 0.2–1.2)
BUN: 16 mg/dL (ref 7–25)
CHLORIDE: 106 mmol/L (ref 98–110)
CO2: 26 mmol/L (ref 20–32)
Calcium: 8.9 mg/dL (ref 8.6–10.3)
Creat: 0.88 mg/dL (ref 0.70–1.33)
GFR, Est African American: 109 mL/min/{1.73_m2} (ref >=60)
GFR, Est Non African American: 94 mL/min/{1.73_m2} (ref >=60)
GLUCOSE: 95 mg/dL (ref 65–99)
Globulin: 3.3 g/dL (calc) (ref 1.9–3.7)
POTASSIUM: 4.6 mmol/L (ref 3.5–5.3)
Sodium: 140 mmol/L (ref 135–146)
TOTAL PROTEIN: 7.1 g/dL (ref 6.1–8.1)

## 2018-06-05 LAB — HEMOGLOBIN A1C
EAG (MMOL/L): 7.1 (calc)
HEMOGLOBIN A1C: 6.1 %{Hb} — AB (ref <5.7)
Mean Plasma Glucose: 128 (calc)

## 2018-06-05 LAB — TSH: TSH: 3.09 m[IU]/L (ref 0.40–4.50)

## 2018-06-05 LAB — T4, FREE: Free T4: 1.1 ng/dL (ref 0.8–1.8)

## 2018-06-05 LAB — PSA, TOTAL WITH REFLEX TO PSA, FREE: PSA, Total: 0.2 ng/mL (ref <=4.0)

## 2018-06-10 ENCOUNTER — Ambulatory Visit (INDEPENDENT_AMBULATORY_CARE_PROVIDER_SITE_OTHER): Payer: PRIVATE HEALTH INSURANCE | Admitting: Family Medicine

## 2018-06-10 ENCOUNTER — Other Ambulatory Visit: Payer: Self-pay

## 2018-06-10 ENCOUNTER — Encounter: Payer: Self-pay | Admitting: Family Medicine

## 2018-06-10 VITALS — BP 125/82 | HR 88 | Temp 98.2°F | Ht 69.0 in | Wt 251.2 lb

## 2018-06-10 DIAGNOSIS — E782 Mixed hyperlipidemia: Secondary | ICD-10-CM

## 2018-06-10 DIAGNOSIS — E669 Obesity, unspecified: Secondary | ICD-10-CM

## 2018-06-10 DIAGNOSIS — L608 Other nail disorders: Secondary | ICD-10-CM | POA: Diagnosis not present

## 2018-06-10 DIAGNOSIS — Z125 Encounter for screening for malignant neoplasm of prostate: Secondary | ICD-10-CM

## 2018-06-10 DIAGNOSIS — Z0000 Encounter for general adult medical examination without abnormal findings: Secondary | ICD-10-CM

## 2018-06-10 DIAGNOSIS — R7303 Prediabetes: Secondary | ICD-10-CM

## 2018-06-10 NOTE — Progress Notes (Signed)
Subjective:    Patient ID: Alexander Atkinson, male    DOB: 04-May-1961, 58 y.o.   MRN: [id number]  Alexander Atkinson is a 58 y.o. male presenting on 06/10/2018 for Annual Exam   HPI   Here for Annual Physical and Lab Review.  Pre-Diabetes Prior A1c 5.7 to 5.8, last lab result showed elevated A1c 6.1 CBGs:Does not check Meds: Never on meds Currentlynot on ACE/ARB Lifestyle: - Diet (Still improving diet, limiting meals in morning / breakfast, and eating smaller meal for dinner, trying improved, no soft drinks, keeps drinking more water) - Exercise (He has not exercised regularly in past few months, he admits needs to resume [employer] gym exercise and possibly swimming) Denies hypoglycemia  Hyperlipidemia / Weight Gain / Obesity BMI >37 - Last visit with me 11/2017, for routine check up, see prior notes for background information. - Interval update with improved HDL now >40 and LDL elevated 121 - Today patient reports he has not been as active, and has had wt gain - Weight gain 15 lbs in past 6 months - He wants to restart exercise back at [facility], see above.  Nail Pitting Prior history with hand foot mouth disease in past >1-2 year, affected nails. Now he has admitted gradual changes both hands some fingernails with some pitting and linear ridges on fingernails, non tender, not brittle. - he normally goes to Dermatology Dr Brendolyn Patty, and he has not returned yet, maybe soon for this.  Health Maintenance: Due for Flu Shot, declines today despite counseling on benefits - he has a lingering cold with some residual cough. Declines today.  Prostate CA Screening: Prior PSA / DRE reported normal. Last PSA 0.2 (05/2018). Currently asymptomatic without BPH LUTS. No known family history of prostate CA.   Colon CA Screening: Last Colonoscopy 11/2015 (done by [facility] Dr Allen Norris), results with no polyps, good for 10 years. Currently asymptomatic. No known family history of colon CA. Due for screening test       Depression screen [facility] 2/9 06/10/2018 12/11/2017 06/13/2017  Decreased Interest 0 0 0  Down, Depressed, Hopeless 0 0 0  PHQ - 2 Score 0 0 0  Altered sleeping 1 - -  Tired, decreased energy 1 - -  Change in appetite 0 - -  Feeling bad or failure about yourself  0 - -  Trouble concentrating 0 - -  Moving slowly or fidgety/restless 0 - -  Suicidal thoughts 0 - -  PHQ-9 Score 2 - -  Difficult doing work/chores Not difficult at all - -   No flowsheet data found.   No past medical history on file. Past Surgical History:  Procedure Laterality Date  . APPENDECTOMY    . COLONOSCOPY WITH PROPOFOL N/A 11/22/2015   Procedure: COLONOSCOPY WITH PROPOFOL;  Surgeon: [name], MD;  Location: [facility] ENDOSCOPY;  Service: Endoscopy;  Laterality: N/A;   Social History   Socioeconomic History  . Marital status: Single    Spouse name: Not on file  . Number of children: Not on file  . Years of education: Not on file  . Highest education level: Not on file  Occupational History  . Not on file  Social Needs  . Financial resource strain: Not on file  . Food insecurity:    Worry: Not on file    Inability: Not on file  . Transportation needs:    Medical: Not on file    Non-medical: Not on file  Tobacco Use  . Smoking status:  Never Smoker  . Smokeless tobacco: Never Used  Substance and Sexual Activity  . Alcohol use: No  . Drug use: No  . Sexual activity: Not on file  Lifestyle  . Physical activity:    Days per week: Not on file    Minutes per session: Not on file  . Stress: Not on file  Relationships  . Social connections:    Talks on phone: Not on file    Gets together: Not on file    Attends religious service: Not on file    Active member of club or organization: Not on file    Attends meetings of clubs or organizations: Not on file    Relationship status: Not on file  . Intimate partner violence:    Fear of current or ex partner: Not on file    Emotionally abused: Not on  file    Physically abused: Not on file    Forced sexual activity: Not on file  Other Topics Concern  . Not on file  Social History Narrative  . Not on file   Family History  Problem Relation Age of Onset  . Thyroid disease Mother   . Kidney disease Maternal Grandfather   . Hypothyroidism Brother   . Hypothyroidism Brother   . Prostate cancer Neg Hx   . Colon cancer Neg Hx    No current outpatient medications on file prior to visit.   No current facility-administered medications on file prior to visit.     Review of Systems  Constitutional: Negative for activity change, appetite change, chills, diaphoresis, fatigue and fever.  HENT: Negative for congestion and hearing loss.   Eyes: Negative for visual disturbance.  Respiratory: Negative for apnea, cough, choking, chest tightness, shortness of breath and wheezing.   Cardiovascular: Negative for chest pain, palpitations and leg swelling.  Gastrointestinal: Negative for abdominal pain, anal bleeding, blood in stool, constipation, diarrhea, nausea and vomiting.  Endocrine: Negative for cold intolerance and polyuria.  Genitourinary: Negative for dysuria, frequency and hematuria.  Musculoskeletal: Negative for arthralgias, back pain and neck pain.  Skin: Negative for rash.       Nail pitting  Allergic/Immunologic: Negative for environmental allergies.  Neurological: Negative for dizziness, weakness, light-headedness, numbness and headaches.  Hematological: Negative for adenopathy.  Psychiatric/Behavioral: Negative for behavioral problems, dysphoric mood and sleep disturbance. The patient is not nervous/anxious.    Per HPI unless specifically indicated above     Objective:    BP 125/82   Pulse 88   Temp 98.2 F (36.8 C) (Oral)   Ht 5\' 9"  (1.753 m)   Wt 251 lb 3.2 oz (113.9 kg)   SpO2 94%   BMI 37.10 kg/m   Wt Readings from Last 3 Encounters:  06/10/18 251 lb 3.2 oz (113.9 kg)  12/11/17 235 lb (106.6 kg)  06/13/17 226  lb (102.5 kg)    Physical Exam Vitals signs and nursing note reviewed.  Constitutional:      General: He is not in acute distress.    Appearance: He is well-developed. He is not diaphoretic.     Comments: Well-appearing, comfortable, cooperative  HENT:     Head: Normocephalic and atraumatic.     Comments: Frontal / maxillary sinuses non-tender. Nares patent without purulence or edema. Bilateral TMs clear without erythema, effusion or bulging. Oropharynx clear without erythema, exudates, edema or asymmetry. Eyes:     General:        Right eye: No discharge.  Left eye: No discharge.     Conjunctiva/sclera: Conjunctivae normal.     Pupils: Pupils are equal, round, and reactive to light.  Neck:     Musculoskeletal: Normal range of motion and neck supple.     Thyroid: No thyromegaly.  Cardiovascular:     Rate and Rhythm: Normal rate and regular rhythm.     Heart sounds: Normal heart sounds. No murmur.  Pulmonary:     Effort: Pulmonary effort is normal. No respiratory distress.     Breath sounds: Normal breath sounds. No wheezing or rales.  Abdominal:     General: Bowel sounds are normal. There is no distension.     Palpations: Abdomen is soft. There is no mass.     Tenderness: There is no abdominal tenderness.  Musculoskeletal: Normal range of motion.        General: No tenderness.     Comments: Upper / Lower Extremities: - Normal muscle tone, strength bilateral upper extremities 5/5, lower extremities 5/5  Lymphadenopathy:     Cervical: No cervical adenopathy.  Skin:    General: Skin is warm and dry.     Findings: No erythema or rash.     Comments: Bilateral hands with some slight pitting of nails, small points, and also some increased linear lines or thickening of nails. Non tender, no other nail bed changes. No erythema or edema.  Neurological:     Mental Status: He is alert and oriented to person, place, and time.     Comments: Distal sensation intact to light touch all  extremities  Psychiatric:        Behavior: Behavior normal.     Comments: Well groomed, good eye contact, normal speech and thoughts    Results for orders placed or performed in visit on 06/03/18  T4, free  Result Value Ref Range   Free T4 1.1 0.8 - 1.8 ng/dL  TSH  Result Value Ref Range   TSH 3.09 0.40 - 4.50 mIU/L  PSA, Total with Reflex to PSA, Free  Result Value Ref Range   PSA, Total 0.2 < OR = 4.0 ng/mL  Lipid panel  Result Value Ref Range   Cholesterol 187 <200 mg/dL   HDL 49 >40 mg/dL   Triglycerides 75 <150 mg/dL   LDL Cholesterol (Calc) 121 (H) mg/dL (calc)   Total CHOL/HDL Ratio 3.8 <5.0 (calc)   Non-HDL Cholesterol (Calc) 138 (H) <130 mg/dL (calc)  COMPLETE METABOLIC PANEL WITH GFR  Result Value Ref Range   Glucose, Bld 95 65 - 99 mg/dL   BUN 16 7 - 25 mg/dL   Creat 0.88 0.70 - 1.33 mg/dL   GFR, Est Non African American 94 > OR = 60 mL/min/1.46m2   GFR, Est African American 109 > OR = 60 mL/min/1.81m2   BUN/Creatinine Ratio NOT APPLICABLE 6 - 22 (calc)   Sodium 140 135 - 146 mmol/L   Potassium 4.6 3.5 - 5.3 mmol/L   Chloride 106 98 - 110 mmol/L   CO2 26 20 - 32 mmol/L   Calcium 8.9 8.6 - 10.3 mg/dL   Total Protein 7.1 6.1 - 8.1 g/dL   Albumin 3.8 3.6 - 5.1 g/dL   Globulin 3.3 1.9 - 3.7 g/dL (calc)   AG Ratio 1.2 1.0 - 2.5 (calc)   Total Bilirubin 0.8 0.2 - 1.2 mg/dL   Alkaline phosphatase (APISO) 70 40 - 115 U/L   AST 18 10 - 35 U/L   ALT 21 9 - 46 U/L  CBC with  Differential/Platelet  Result Value Ref Range   WBC 7.0 3.8 - 10.8 Thousand/uL   RBC 4.62 4.20 - 5.80 Million/uL   Hemoglobin 15.3 13.2 - 17.1 g/dL   HCT 43.6 38.5 - 50.0 %   MCV 94.4 80.0 - 100.0 fL   MCH 33.1 (H) 27.0 - 33.0 pg   MCHC 35.1 32.0 - 36.0 g/dL   RDW 12.4 11.0 - 15.0 %   Platelets 271 140 - 400 Thousand/uL   MPV 10.7 7.5 - 12.5 fL   Neutro Abs 3,101 1,500 - 7,800 cells/uL   Lymphs Abs 2,842 850 - 3,900 cells/uL   Absolute Monocytes 784 200 - 950 cells/uL   Eosinophils  Absolute 210 15 - 500 cells/uL   Basophils Absolute 63 0 - 200 cells/uL   Neutrophils Relative % 44.3 %   Total Lymphocyte 40.6 %   Monocytes Relative 11.2 %   Eosinophils Relative 3.0 %   Basophils Relative 0.9 %  Hemoglobin A1c  Result Value Ref Range   Hgb A1c MFr Bld 6.1 (H) <5.7 % of total Hgb   Mean Plasma Glucose 128 (calc)   eAG (mmol/L) 7.1 (calc)      Assessment & Plan:   Problem List Items Addressed This Visit    Mixed hyperlipidemia    Mild elevated LDL now, but improved HDL and TG, on improved lifestyle, now needs resume exercise Last lipid panel 05/2018 Calculated ASCVD 10 yr risk score < 7%  Plan: 1. Not on statin. Prior discussed ASCVD risk. 2. Encourage improved lifestyle - low carb/cholesterol, reduce portion size, continue improving regular exercise Follow-up yearly lipid 05/2019       Obesity (BMI 35.0-39.9 without comorbidity)    Significant wt gain up 15-16 lbs in 6 months, less exercise Encourage restart diet / exercise regimen, back to gym, [employer]      Pre-diabetes    Previously well-controlled Pre-DM now with elevated A1c up to 6.1, from prior 5.7 to 5.8 Concern with obesity, HLD  Plan:  1. Not on any therapy currently 2. Encourage improved lifestyle - low carb, low sugar diet, reduce portion size, restart regular exercise with swimming at [facility] - goal to resume exercise 3. Follow-up 6 months PreDM A1c      Screening for prostate cancer    Other Visit Diagnoses    Annual physical exam    -  Primary   Pitting of nails       Uncertain etiology, could be dermatologic such as eczema or alopecia or chronic nail changes, not appear fungal, no history of psoriasis. He can discuss w/ Derm      Annual Updated Health Maintenance information Reviewed recent lab results with patient Encouraged improvement to lifestyle with diet and exercise - Goal of weight loss   No orders of the defined types were placed in this encounter.   Follow up  plan: Return in about 6 months (around 12/09/2018) for 6 month follow-up - PreDM A1c, Weight Check.  Nobie Putnam, [facility] 06/10/2018, 9:19 AM

## 2018-06-10 NOTE — Assessment & Plan Note (Signed)
Mild elevated LDL now, but improved HDL and TG, on improved lifestyle, now needs resume exercise Last lipid panel 05/2018 Calculated ASCVD 10 yr risk score < 7%  Plan: 1. Not on statin. Prior discussed ASCVD risk. 2. Encourage improved lifestyle - low carb/cholesterol, reduce portion size, continue improving regular exercise Follow-up yearly lipid 05/2019

## 2018-06-10 NOTE — Patient Instructions (Addendum)
Thank you for coming to the office today.  1. Chemistry - Normal results, including electrolytes, kidney and liver function. Normal fasting blood sugar   2. Hemoglobin A1c (Pre-Diabetes) - A1c 6.1, up from 5.7 to 5.8, but similar to 1 year ago, still in range of Pre-Diabetes (>5.7 to 6.4)   3. PSA Prostate Cancer Screening - 0.2, negative.  4. TSH Thyroid Function Tests - Normal.  5. Cholesterol - Elevated LDL up to 121, prior 84. Otherwise controlled.  6. CBC Blood Counts - Normal, no anemia, other abnormality  Nail pitting is common but no clear diagnosis, may be related to skin conditions, such as eczema, possible psoriasis - I don't think it is Hand Foot Mouth - please check with Dr Brendolyn Patty further and let me know.  Please schedule a Follow-up Appointment to: Return in about 6 months (around 12/09/2018) for 6 month follow-up - PreDM A1c, Weight Check.  If you have any other questions or concerns, please feel free to call the office or send a message through [place]. You may also schedule an earlier appointment if necessary.  Additionally, you may be receiving a survey about your experience at our office within a few days to 1 week by e-mail or mail. We value your feedback.  Nobie Putnam, DO [place]

## 2018-06-10 NOTE — Assessment & Plan Note (Signed)
Previously well-controlled Pre-DM now with elevated A1c up to 6.1, from prior 5.7 to 5.8 Concern with obesity, HLD  Plan:  1. Not on any therapy currently 2. Encourage improved lifestyle - low carb, low sugar diet, reduce portion size, restart regular exercise with swimming at [facility] - goal to resume exercise 3. Follow-up 6 months PreDM A1c

## 2018-06-10 NOTE — Assessment & Plan Note (Signed)
Significant wt gain up 15-16 lbs in 6 months, less exercise Encourage restart diet / exercise regimen, back to gym, [facility]

## 2018-10-28 ENCOUNTER — Encounter: Payer: Self-pay | Admitting: Family Medicine

## 2018-10-28 ENCOUNTER — Telehealth: Payer: Self-pay

## 2018-10-28 ENCOUNTER — Ambulatory Visit: Payer: PRIVATE HEALTH INSURANCE | Admitting: Family Medicine

## 2018-10-28 ENCOUNTER — Ambulatory Visit (INDEPENDENT_AMBULATORY_CARE_PROVIDER_SITE_OTHER): Payer: PRIVATE HEALTH INSURANCE | Admitting: Family Medicine

## 2018-10-28 ENCOUNTER — Other Ambulatory Visit: Payer: Self-pay

## 2018-10-28 DIAGNOSIS — Z20828 Contact with and (suspected) exposure to other viral communicable diseases: Secondary | ICD-10-CM | POA: Diagnosis not present

## 2018-10-28 NOTE — Patient Instructions (Signed)
AVS info given by phone. No MyChart Active.

## 2018-10-28 NOTE — Telephone Encounter (Signed)
The pt contacted the after hours line because he was concern of a possible exposure to the Coronavirus. He went to [facility] on 10/16/2008 and ate at [employer] where an employee tested positive. He was not notified by anyone of an exposure and unsure if he even came in contact with the employee. He is asymptomatic, denies fever, coughing, SOB, headaches, lose of smell/taste and diarrhea.  The pt was scheduled for an appt today at 11:00am.

## 2018-10-28 NOTE — Progress Notes (Signed)
Virtual Visit via Telephone The purpose of this virtual visit is to provide medical care while limiting exposure to the novel coronavirus (COVID19) for both patient and office staff.  Consent was obtained for phone visit:  Yes.   Answered questions that patient had about telehealth interaction:  Yes.   I discussed the limitations, risks, security and privacy concerns of performing an evaluation and management service by telephone. I also discussed with the patient that there may be a patient responsible charge related to this service. The patient expressed understanding and agreed to proceed.  Patient Location: Home Provider Location: [facility])   ---------------------------------------------------------------------- Chief Complaint  Patient presents with  . covid    onset 06/05 at beach ate out at [place] where one of the employee were positive but denies any symptoms    S: Reviewed CMA documentation. I have called patient and gathered additional HPI as follows:  POTENTIAL COVID19 EXPOSURE Reports that found out later, about a potential exposure COVID19. He was at beach and ate out at restaurant on 10/17/18, about 11 days ago, and he found out later through [employer] online that an employee at that restaurant was positive. He does not know if he came in contact with this employee or not. He has monitored for any symptoms. He has no symptoms of COVID19 or other concerns at this time. He called to ask about antibody testing or what should he do next.  Denies any high risk travel to areas of current concern for COVID19.  Denies any fevers, chills, sweats, body ache, cough, shortness of breath, sinus pain or pressure, headache, abdominal pain, diarrhea  -------------------------------------------------------------------------- O: No physical exam performed due to remote telephone  encounter.  -------------------------------------------------------------------------- A&P:   Potential Exposure COVID19 Asymptomatic currently - Unlikely to have had transmission of COVID19, OR possible asymptomatic patient with exposure. - Reassuring without high risk symptoms - Afebrile, without dyspnea - No comorbid pulmonary conditions (asthma, COPD) or immunocompromise  - Currently patient is LOW RISK for COVID19 based on asymptomatic now 11 days after potential exposure, again unconfirmed  1. Reassurance, my recommendation is to wait and monitor symptoms, can remain in relative home isolation / standard precautions through Friday 6/19 2. IF he develops COVID19 symptoms, needs to notify us for virtual visit again to treat and order testing 3. IF he does not develop COVID19 symptoms after Friday 6/19 or 2 weeks since potential exposure, then he has two options, one he could do nothing and monitor and use precautions as unlikely to have developed COVID19. And two, he could call us back next week and request a blood antibody test, we can order this and check to see if he has developed any antibody, to give him more information, even if positive unlikely to require other treatment, and cannot determine immunity based on this test with current knowledge base  No orders of the defined types were placed in this encounter.   OPTIONAL RECOMMENDED self quarantine for patient safety for PREVENTION ONLY   If symptoms do not resolve or significantly improve OR if WORSENING - fever / cough - or worsening shortness of breath - then should contact us and seek advice on next steps in treatment at home vs where/when to seek care at Urgent Care or Hospital ED for further intervention and possible testing if indicated.  Patient verbalizes understanding with the above medical recommendations including the limitation of remote medical advice.  Specific follow-up / call-back criteria were given for patient  to  follow-up or seek medical care more urgently if needed.  - Time spent in direct consultation with patient on phone: 9 minutes  Nobie Putnam, [facility] 10/28/2018, 11:44 AM

## 2018-12-11 ENCOUNTER — Other Ambulatory Visit: Payer: Self-pay | Admitting: Family Medicine

## 2018-12-11 ENCOUNTER — Other Ambulatory Visit: Payer: Self-pay

## 2018-12-11 ENCOUNTER — Ambulatory Visit (INDEPENDENT_AMBULATORY_CARE_PROVIDER_SITE_OTHER): Payer: PRIVATE HEALTH INSURANCE | Admitting: Family Medicine

## 2018-12-11 ENCOUNTER — Encounter: Payer: Self-pay | Admitting: Family Medicine

## 2018-12-11 VITALS — BP 130/76 | HR 94 | Temp 98.3°F | Resp 16 | Ht 69.0 in | Wt 242.0 lb

## 2018-12-11 DIAGNOSIS — Z125 Encounter for screening for malignant neoplasm of prostate: Secondary | ICD-10-CM

## 2018-12-11 DIAGNOSIS — Z0000 Encounter for general adult medical examination without abnormal findings: Secondary | ICD-10-CM

## 2018-12-11 DIAGNOSIS — R7303 Prediabetes: Secondary | ICD-10-CM

## 2018-12-11 DIAGNOSIS — E669 Obesity, unspecified: Secondary | ICD-10-CM

## 2018-12-11 DIAGNOSIS — R3915 Urgency of urination: Secondary | ICD-10-CM | POA: Diagnosis not present

## 2018-12-11 DIAGNOSIS — E782 Mixed hyperlipidemia: Secondary | ICD-10-CM

## 2018-12-11 LAB — POCT GLYCOSYLATED HEMOGLOBIN (HGB A1C): Hemoglobin A1C: 6 % — AB (ref 4.0–5.6)

## 2018-12-11 NOTE — Assessment & Plan Note (Signed)
Stable to improved PreDM A1c 6.0 Concern with obesity, HLD  Plan:  1. Not on any therapy currently 2. Encourage improved lifestyle - low carb, low sugar diet, reduce portion size, restart regular exercise with swimming at [facility] - goal to resume exercise 3. Follow-up 6 months physical

## 2018-12-11 NOTE — Assessment & Plan Note (Signed)
Improved wt loss with some lifestyle improvement Counseling

## 2018-12-11 NOTE — Progress Notes (Signed)
Subjective:    Patient ID: Alexander Atkinson, male    DOB: 11/01/1960, 58 y.o.   MRN: [id number]  Alexander Atkinson is a 58 y.o. male presenting on 12/11/2018 for PreDM   HPI   Pre-Diabetes / Obesity BMI >35 Now A1c to 6.0. Previous readings 5.7 to 6.1 CBGs:Does not check Meds: Never on meds Currentlynot on ACE/ARB Lifestyle: - Weight down 9 lbs - Diet (Reports reducing portion size with good results) - Exercise (He did resume [employer] membership early in Jan 2020 then couldn't go anymore since March 2020, instead he has been doing walking outdoor) Denies hypoglycemia  Constipation OTC Colon Health Capsules (Probiotic) for constipation and diarrhea symptoms. Doing well.  Urinary Urgency No prior dx BPH. Reports gradual onset urinary urgency more often. No prior treatment. Last PSA trend 0.4 and then now 0.2 in 2020. Negative    Depression screen [facility] 2/9 12/11/2018 10/28/2018 06/10/2018  Decreased Interest 0 0 0  Down, Depressed, Hopeless 0 0 0  PHQ - 2 Score 0 0 0  Altered sleeping - - 1  Tired, decreased energy - - 1  Change in appetite - - 0  Feeling bad or failure about yourself  - - 0  Trouble concentrating - - 0  Moving slowly or fidgety/restless - - 0  Suicidal thoughts - - 0  PHQ-9 Score - - 2  Difficult doing work/chores - - Not difficult at all    Social History   Tobacco Use  . Smoking status: Never Smoker  . Smokeless tobacco: Never Used  Substance Use Topics  . Alcohol use: No  . Drug use: No    Review of Systems Per HPI unless specifically indicated above     Objective:    BP 130/76   Pulse 94   Temp 98.3 F (36.8 C) (Oral)   Resp 16   Ht 5\' 9"  (1.753 m)   Wt 242 lb (109.8 kg)   BMI 35.74 kg/m   Wt Readings from Last 3 Encounters:  12/11/18 242 lb (109.8 kg)  06/10/18 251 lb 3.2 oz (113.9 kg)  12/11/17 235 lb (106.6 kg)    Physical Exam Vitals signs and nursing note reviewed.  Constitutional:      General: He is not in acute  distress.    Appearance: He is well-developed. He is not diaphoretic.     Comments: Well-appearing, comfortable, cooperative  HENT:     Head: Normocephalic and atraumatic.  Eyes:     General:        Right eye: No discharge.        Left eye: No discharge.     Conjunctiva/sclera: Conjunctivae normal.  Cardiovascular:     Rate and Rhythm: Normal rate.  Pulmonary:     Effort: Pulmonary effort is normal.  Skin:    General: Skin is warm and dry.     Findings: No erythema or rash.  Neurological:     Mental Status: He is alert and oriented to person, place, and time.  Psychiatric:        Behavior: Behavior normal.     Comments: Well groomed, good eye contact, normal speech and thoughts    Results for orders placed or performed in visit on 12/11/18  POCT HgB A1C  Result Value Ref Range   Hemoglobin A1C 6.0 (A) 4.0 - 5.6 %   Recent Labs    06/04/18 0759 12/11/18 0831  HGBA1C 6.1* 6.0*       Assessment & Plan:  Problem List Items Addressed This Visit    Obesity (BMI 35.0-39.9 without comorbidity)    Improved wt loss with some lifestyle improvement Counseling      Pre-diabetes - Primary    Stable to improved PreDM A1c 6.0 Concern with obesity, HLD  Plan:  1. Not on any therapy currently 2. Encourage improved lifestyle - low carb, low sugar diet, reduce portion size, restart regular exercise with swimming at [facility] - goal to resume exercise 3. Follow-up 6 months physical      Relevant Orders   POCT HgB A1C (Completed)    Other Visit Diagnoses    Urinary urgency        Clinically likely mild BPH symptoms Not interested in treatment at this time. May try OTC [place] Follow-up clinically   No orders of the defined types were placed in this encounter.     Follow up plan: Return in about 6 months (around 06/13/2019) for Annual Physical.  Future labs ordered for 06/17/19  Nobie Putnam, [facility]  12/11/2018, 8:22 AM

## 2018-12-11 NOTE — Patient Instructions (Addendum)
Thank you for coming to the office today.  Recent Labs    06/04/18 0759 12/11/18 0831  HGBA1C 6.1* 6.0*   Possible enlarged prostate - we can consider treatment in future. One herbal supplement called - Saw Palmetto if you want to try this one OTC. Otherwise we can use rx if it is bothering you more.  DUE for FASTING BLOOD WORK (no food or drink after midnight before the lab appointment, only water or coffee without cream/sugar on the morning of)  SCHEDULE "Lab Only" visit in the morning at the clinic for lab draw in 6 MONTHS   - Make sure Lab Only appointment is at about 1 week before your next appointment, so that results will be available  For Lab Results, once available within 2-3 days of blood draw, you can can log in to MyChart online to view your results and a brief explanation. Also, we can discuss results at next follow-up visit.   Please schedule a Follow-up Appointment to: Return in about 6 months (around 06/13/2019) for Annual Physical.  If you have any other questions or concerns, please feel free to call the office or send a message through [place]. You may also schedule an earlier appointment if necessary.  Additionally, you may be receiving a survey about your experience at our office within a few days to 1 week by e-mail or mail. We value your feedback.  Nobie Putnam, DO [place]

## 2019-03-17 ENCOUNTER — Ambulatory Visit (INDEPENDENT_AMBULATORY_CARE_PROVIDER_SITE_OTHER): Payer: PRIVATE HEALTH INSURANCE

## 2019-03-17 ENCOUNTER — Other Ambulatory Visit: Payer: Self-pay

## 2019-03-17 DIAGNOSIS — Z23 Encounter for immunization: Secondary | ICD-10-CM

## 2019-05-28 ENCOUNTER — Ambulatory Visit: Payer: PRIVATE HEALTH INSURANCE | Attending: Internal Medicine

## 2019-05-28 DIAGNOSIS — Z20822 Contact with and (suspected) exposure to covid-19: Secondary | ICD-10-CM

## 2019-05-29 LAB — NOVEL CORONAVIRUS, NAA: SARS-CoV-2, NAA: DETECTED — AB

## 2019-06-17 ENCOUNTER — Other Ambulatory Visit: Payer: Self-pay

## 2019-06-17 DIAGNOSIS — R7303 Prediabetes: Secondary | ICD-10-CM

## 2019-06-17 DIAGNOSIS — E782 Mixed hyperlipidemia: Secondary | ICD-10-CM

## 2019-06-17 DIAGNOSIS — Z125 Encounter for screening for malignant neoplasm of prostate: Secondary | ICD-10-CM

## 2019-06-17 DIAGNOSIS — Z0000 Encounter for general adult medical examination without abnormal findings: Secondary | ICD-10-CM

## 2019-06-18 LAB — HEMOGLOBIN A1C
Hgb A1c MFr Bld: 5.9 % of total Hgb — ABNORMAL HIGH (ref <5.7)
Mean Plasma Glucose: 123 (calc)
eAG (mmol/L): 6.8 (calc)

## 2019-06-18 LAB — CBC WITH DIFFERENTIAL/PLATELET
Absolute Monocytes: 641 cells/uL (ref 200–950)
Basophils Absolute: 73 cells/uL (ref 0–200)
Basophils Relative: 1.2 %
Eosinophils Absolute: 214 cells/uL (ref 15–500)
Eosinophils Relative: 3.5 %
HCT: 42.9 % (ref 38.5–50.0)
Hemoglobin: 14.9 g/dL (ref 13.2–17.1)
Lymphs Abs: 2330 cells/uL (ref 850–3900)
MCH: 33 pg (ref 27.0–33.0)
MCHC: 34.7 g/dL (ref 32.0–36.0)
MCV: 95.1 fL (ref 80.0–100.0)
MPV: 11.3 fL (ref 7.5–12.5)
Monocytes Relative: 10.5 %
Neutro Abs: 2843 cells/uL (ref 1500–7800)
Neutrophils Relative %: 46.6 %
Platelets: 250 10*3/uL (ref 140–400)
RBC: 4.51 10*6/uL (ref 4.20–5.80)
RDW: 12.4 % (ref 11.0–15.0)
Total Lymphocyte: 38.2 %
WBC: 6.1 10*3/uL (ref 3.8–10.8)

## 2019-06-18 LAB — COMPLETE METABOLIC PANEL WITH GFR
AG Ratio: 1.3 (calc) (ref 1.0–2.5)
ALT: 31 U/L (ref 9–46)
AST: 25 U/L (ref 10–35)
Albumin: 4 g/dL (ref 3.6–5.1)
Alkaline phosphatase (APISO): 76 U/L (ref 35–144)
BUN: 14 mg/dL (ref 7–25)
CO2: 22 mmol/L (ref 20–32)
Calcium: 8.6 mg/dL (ref 8.6–10.3)
Chloride: 109 mmol/L (ref 98–110)
Creat: 0.93 mg/dL (ref 0.70–1.25)
GFR, Est African American: 103 mL/min/{1.73_m2} (ref >=60)
GFR, Est Non African American: 89 mL/min/{1.73_m2} (ref >=60)
Globulin: 3 g/dL (calc) (ref 1.9–3.7)
Glucose, Bld: 96 mg/dL (ref 65–99)
Potassium: 4.2 mmol/L (ref 3.5–5.3)
Sodium: 138 mmol/L (ref 135–146)
Total Bilirubin: 0.7 mg/dL (ref 0.2–1.2)
Total Protein: 7 g/dL (ref 6.1–8.1)

## 2019-06-18 LAB — TSH: TSH: 3.4 mIU/L (ref 0.40–4.50)

## 2019-06-18 LAB — PSA: PSA: 1.1 ng/mL (ref <=4.0)

## 2019-06-18 LAB — T4, FREE: Free T4: 1 ng/dL (ref 0.8–1.8)

## 2019-06-18 LAB — LIPID PANEL
Cholesterol: 174 mg/dL (ref <200)
HDL: 50 mg/dL (ref >=40)
LDL Cholesterol (Calc): 105 mg/dL (calc) — ABNORMAL HIGH
Non-HDL Cholesterol (Calc): 124 mg/dL (calc) (ref <130)
Total CHOL/HDL Ratio: 3.5 (calc) (ref <5.0)
Triglycerides: 94 mg/dL (ref <150)

## 2019-06-25 ENCOUNTER — Other Ambulatory Visit: Payer: Self-pay | Admitting: Family Medicine

## 2019-06-25 ENCOUNTER — Ambulatory Visit (INDEPENDENT_AMBULATORY_CARE_PROVIDER_SITE_OTHER): Payer: PRIVATE HEALTH INSURANCE | Admitting: Family Medicine

## 2019-06-25 ENCOUNTER — Other Ambulatory Visit: Payer: Self-pay

## 2019-06-25 ENCOUNTER — Encounter: Payer: Self-pay | Admitting: Family Medicine

## 2019-06-25 VITALS — Ht 69.0 in | Wt 229.0 lb

## 2019-06-25 DIAGNOSIS — E669 Obesity, unspecified: Secondary | ICD-10-CM

## 2019-06-25 DIAGNOSIS — E7800 Pure hypercholesterolemia, unspecified: Secondary | ICD-10-CM

## 2019-06-25 DIAGNOSIS — Z125 Encounter for screening for malignant neoplasm of prostate: Secondary | ICD-10-CM

## 2019-06-25 DIAGNOSIS — R7303 Prediabetes: Secondary | ICD-10-CM

## 2019-06-25 DIAGNOSIS — Z0000 Encounter for general adult medical examination without abnormal findings: Secondary | ICD-10-CM

## 2019-06-25 DIAGNOSIS — E782 Mixed hyperlipidemia: Secondary | ICD-10-CM | POA: Diagnosis not present

## 2019-06-25 NOTE — Patient Instructions (Addendum)
  DUE for FASTING BLOOD WORK (no food or drink after midnight before the lab appointment, only water or coffee without cream/sugar on the morning of)  SCHEDULE "Lab Only" visit in the morning at the clinic for lab draw in 1 YEAR  - Make sure Lab Only appointment is at about 1 week before your next appointment, so that results will be available  For Lab Results, once available within 2-3 days of blood draw, you can can log in to MyChart online to view your results and a brief explanation. Also, we can discuss results at next follow-up visit.   Please schedule a Follow-up Appointment to: Return in about 1 year (around 06/24/2020) for Annual Physical.  If you have any other questions or concerns, please feel free to call the office or send a message through [place]. You may also schedule an earlier appointment if necessary.  Additionally, you may be receiving a survey about your experience at our office within a few days to 1 week by e-mail or mail. We value your feedback.  Nobie Putnam, DO [place]

## 2019-06-25 NOTE — Assessment & Plan Note (Signed)
Significant improved LDL 105 now from 121 and normal HDL TG TC on improved lifestyle wt loss Last lipid panel 05/2019 Calculated ASCVD 10 yr risk score < 7%  Plan: 1. Not on statin. Prior discussed ASCVD risk. 2. Encourage improved lifestyle - low carb/cholesterol, reduce portion size, continue improving regular exercise

## 2019-06-25 NOTE — Assessment & Plan Note (Signed)
Improved PreDM down to A1c 5.9, prior 6 to 6.1 Concern with obesity (improving wt loss), HLD  Plan:  1. Not on any therapy currently 2. Encourage improved lifestyle - low carb, low sugar diet, reduce portion size, regular exercise

## 2019-06-25 NOTE — Assessment & Plan Note (Signed)
Improved weight loss with lifestyle improvements

## 2019-06-25 NOTE — Progress Notes (Signed)
Subjective:    Patient ID: Alexander Atkinson, male    DOB: 05/23/1960, 59 y.o.   MRN: [id number]  Alexander Atkinson is a 59 y.o. male presenting on 06/25/2019 for Annual Exam  Virtual / Telehealth Encounter - Telephone  The purpose of this virtual visit is to provide medical care while limiting exposure to the novel coronavirus (COVID19) for both patient and office staff.  Consent was obtained for remote visit:  Yes.   Answered questions that patient had about telehealth interaction:  Yes.   I discussed the limitations, risks, security and privacy concerns of performing an evaluation and management service by video/telephone. I also discussed with the patient that there may be a patient responsible charge related to this service. The patient expressed understanding and agreed to proceed.  Patient Location: Home Provider Location: [facility] (Office)   HPI   Here for Annual Physical and Lab Review.   Pre-Diabetes / Obesity BMI >33 Previous A1c 6.0 to 6.1, down to 5.9 now. CBGs:Does not check Meds: Never on meds Currentlynot on ACE/ARB Lifestyle: - Weight down 22 lbs - also past history of 1 month, COVID19 and he did lose some weight with that. - Diet (Improved diet) - Exercise (Not going to gym as much [employer] now since December) Denies hypoglycemia  Hyperlipidemia - Last visit with me 2020 - Interval update with improved HDL now 50 and LDL down to 105 (from 121) - He has attributed to improved lifestyle above - Not on cholesterol medicine.  Constipation OTC Colon Health Capsules (Probiotic) for constipation and diarrhea symptoms. Doing well.  Prostate CA Screening: Prior PSA / DRE reported normal. Last PSA 1.1 (05/2019). Currently asymptomatic without BPH LUTS. No known family history of prostate CA.   Colon CA Screening: Last Colonoscopy 11/2015 (done by [facility] Dr Allen Norris), results with no polyps, good for 10 years. Currently asymptomatic. No known family  history of colon CA. Due for screening test    Depression screen [facility] 2/9 12/11/2018 10/28/2018 06/10/2018  Decreased Interest 0 0 0  Down, Depressed, Hopeless 0 0 0  PHQ - 2 Score 0 0 0  Altered sleeping - - 1  Tired, decreased energy - - 1  Change in appetite - - 0  Feeling bad or failure about yourself  - - 0  Trouble concentrating - - 0  Moving slowly or fidgety/restless - - 0  Suicidal thoughts - - 0  PHQ-9 Score - - 2  Difficult doing work/chores - - Not difficult at all    No past medical history on file. Past Surgical History:  Procedure Laterality Date  . APPENDECTOMY    . COLONOSCOPY WITH PROPOFOL N/A 11/22/2015   Procedure: COLONOSCOPY WITH PROPOFOL;  Surgeon: [name], MD;  Location: [facility] ENDOSCOPY;  Service: Endoscopy;  Laterality: N/A;   Social History   Socioeconomic History  . Marital status: Single    Spouse name: Not on file  . Number of children: Not on file  . Years of education: Not on file  . Highest education level: Not on file  Occupational History  . Not on file  Tobacco Use  . Smoking status: Never Smoker  . Smokeless tobacco: Never Used  Substance and Sexual Activity  . Alcohol use: No  . Drug use: No  . Sexual activity: Not on file  Other Topics Concern  . Not on file  Social History Narrative  . Not on file   Social Determinants of Health  Financial Resource Strain:   . Difficulty of Paying Living Expenses: Not on file  Food Insecurity:   . Worried About Charity fundraiser in the Last Year: Not on file  . Ran Out of Food in the Last Year: Not on file  Transportation Needs:   . Lack of Transportation (Medical): Not on file  . Lack of Transportation (Non-Medical): Not on file  Physical Activity:   . Days of Exercise per Week: Not on file  . Minutes of Exercise per Session: Not on file  Stress:   . Feeling of Stress : Not on file  Social Connections:   . Frequency of Communication with Friends and Family: Not on file  .  Frequency of Social Gatherings with Friends and Family: Not on file  . Attends Religious Services: Not on file  . Active Member of Clubs or Organizations: Not on file  . Attends Archivist Meetings: Not on file  . Marital Status: Not on file  Intimate Partner Violence:   . Fear of Current or Ex-Partner: Not on file  . Emotionally Abused: Not on file  . Physically Abused: Not on file  . Sexually Abused: Not on file   Family History  Problem Relation Age of Onset  . Thyroid disease Mother   . Kidney disease Maternal Grandfather   . Hypothyroidism Brother   . Hypothyroidism Brother   . Prostate cancer Neg Hx   . Colon cancer Neg Hx    Current Outpatient Medications on File Prior to Visit  Medication Sig  . Probiotic Product (PHILLIPS COLON HEALTH PO) Take 2 capsules by mouth daily.   No current facility-administered medications on file prior to visit.    Review of Systems Per HPI unless specifically indicated above      Objective:    Ht 5\' 9"  (1.753 m)   Wt 229 lb (103.9 kg)   BMI 33.82 kg/m   Wt Readings from Last 3 Encounters:  06/25/19 229 lb (103.9 kg)  12/11/18 242 lb (109.8 kg)  06/10/18 251 lb 3.2 oz (113.9 kg)    Physical Exam   No physical exam. Virtual visit by phone.  Results for orders placed or performed in visit on 06/17/19  T4, free  Result Value Ref Range   Free T4 1.0 0.8 - 1.8 ng/dL  TSH  Result Value Ref Range   TSH 3.40 0.40 - 4.50 mIU/L  PSA  Result Value Ref Range   PSA 1.1 < OR = 4.0 ng/mL  Lipid panel  Result Value Ref Range   Cholesterol 174 <200 mg/dL   HDL 50 > OR = 40 mg/dL   Triglycerides 94 <150 mg/dL   LDL Cholesterol (Calc) 105 (H) mg/dL (calc)   Total CHOL/HDL Ratio 3.5 <5.0 (calc)   Non-HDL Cholesterol (Calc) 124 <130 mg/dL (calc)  COMPLETE METABOLIC PANEL WITH GFR  Result Value Ref Range   Glucose, Bld 96 65 - 99 mg/dL   BUN 14 7 - 25 mg/dL   Creat 0.93 0.70 - 1.25 mg/dL   GFR, Est Non African American 89  > OR = 60 mL/min/1.30m2   GFR, Est African American 103 > OR = 60 mL/min/1.90m2   BUN/Creatinine Ratio NOT APPLICABLE 6 - 22 (calc)   Sodium 138 135 - 146 mmol/L   Potassium 4.2 3.5 - 5.3 mmol/L   Chloride 109 98 - 110 mmol/L   CO2 22 20 - 32 mmol/L   Calcium 8.6 8.6 - 10.3 mg/dL   Total  Protein 7.0 6.1 - 8.1 g/dL   Albumin 4.0 3.6 - 5.1 g/dL   Globulin 3.0 1.9 - 3.7 g/dL (calc)   AG Ratio 1.3 1.0 - 2.5 (calc)   Total Bilirubin 0.7 0.2 - 1.2 mg/dL   Alkaline phosphatase (APISO) 76 35 - 144 U/L   AST 25 10 - 35 U/L   ALT 31 9 - 46 U/L  CBC with Differential/Platelet  Result Value Ref Range   WBC 6.1 3.8 - 10.8 Thousand/uL   RBC 4.51 4.20 - 5.80 Million/uL   Hemoglobin 14.9 13.2 - 17.1 g/dL   HCT 42.9 38.5 - 50.0 %   MCV 95.1 80.0 - 100.0 fL   MCH 33.0 27.0 - 33.0 pg   MCHC 34.7 32.0 - 36.0 g/dL   RDW 12.4 11.0 - 15.0 %   Platelets 250 140 - 400 Thousand/uL   MPV 11.3 7.5 - 12.5 fL   Neutro Abs 2,843 1,500 - 7,800 cells/uL   Lymphs Abs 2,330 850 - 3,900 cells/uL   Absolute Monocytes 641 200 - 950 cells/uL   Eosinophils Absolute 214 15 - 500 cells/uL   Basophils Absolute 73 0 - 200 cells/uL   Neutrophils Relative % 46.6 %   Total Lymphocyte 38.2 %   Monocytes Relative 10.5 %   Eosinophils Relative 3.5 %   Basophils Relative 1.2 %  Hemoglobin A1c  Result Value Ref Range   Hgb A1c MFr Bld 5.9 (H) <5.7 % of total Hgb   Mean Plasma Glucose 123 (calc)   eAG (mmol/L) 6.8 (calc)      Assessment & Plan:   Problem List Items Addressed This Visit    Pure hypercholesterolemia    Significant improved LDL 105 now from 121 and normal HDL TG TC on improved lifestyle wt loss Last lipid panel 05/2019 Calculated ASCVD 10 yr risk score < 7%  Plan: 1. Not on statin. Prior discussed ASCVD risk. 2. Encourage improved lifestyle - low carb/cholesterol, reduce portion size, continue improving regular exercise      Pre-diabetes    Improved PreDM down to A1c 5.9, prior 6 to 6.1 Concern  with obesity (improving wt loss), HLD  Plan:  1. Not on any therapy currently 2. Encourage improved lifestyle - low carb, low sugar diet, reduce portion size, regular exercise      Obesity (BMI 30.0-34.9)    Improved weight loss with lifestyle improvements       Other Visit Diagnoses    Annual physical exam    -  Primary      Updated Health Maintenance information Reviewed recent lab results with patient Encouraged improvement to lifestyle with diet and exercise - Goal of weight loss / maintain weight loss  No orders of the defined types were placed in this encounter.    Follow up plan: Return in about 1 year (around 06/24/2020) for Annual Physical.   Future orders signed for 06/20/20  Nobie Putnam, [facility] 06/25/2019, 9:25 AM

## 2019-09-04 ENCOUNTER — Ambulatory Visit: Payer: PRIVATE HEALTH INSURANCE | Attending: Internal Medicine

## 2019-09-04 ENCOUNTER — Other Ambulatory Visit: Payer: Self-pay

## 2019-09-04 DIAGNOSIS — Z23 Encounter for immunization: Secondary | ICD-10-CM

## 2019-09-04 NOTE — Progress Notes (Signed)
   Covid-19 Vaccination Clinic  Name:  Alexander Atkinson    MRN: [id number] DOB: 03-11-1961  09/04/2019  [name] was observed post Covid-19 immunization for 15 minutes without incident. He was provided with Vaccine Information Sheet and instruction to access the V-Safe system.   [name] was instructed to call 911 with any severe reactions post vaccine: Marland Kitchen Difficulty breathing  . Swelling of face and throat  . A fast heartbeat  . A bad rash all over body  . Dizziness and weakness   Immunizations Administered    Name Date Dose VIS Date Route   Pfizer COVID-19 Vaccine 09/04/2019 10:50 AM 0.3 mL 07/08/2018 Intramuscular   Manufacturer: [employer], [employer]   Lot: [id number]   [place]: [id number]

## 2019-09-29 ENCOUNTER — Ambulatory Visit: Payer: PRIVATE HEALTH INSURANCE | Attending: Internal Medicine

## 2019-09-29 ENCOUNTER — Other Ambulatory Visit: Payer: Self-pay

## 2019-09-29 DIAGNOSIS — Z23 Encounter for immunization: Secondary | ICD-10-CM

## 2019-09-29 NOTE — Progress Notes (Signed)
   Covid-19 Vaccination Clinic  Name:  Alexander Atkinson    MRN: [id number] DOB: 11/14/1960  09/29/2019  Alexander Atkinson was observed post Covid-19 immunization for 15 minutes without incident. He was provided with Vaccine Information Sheet and instruction to access the V-Safe system.   Alexander Atkinson was instructed to call 911 with any severe reactions post vaccine: Marland Kitchen Difficulty breathing  . Swelling of face and throat  . A fast heartbeat  . A bad rash all over body  . Dizziness and weakness   Immunizations Administered    Name Date Dose VIS Date Route   Pfizer COVID-19 Vaccine 09/29/2019 10:31 AM 0.3 mL 07/08/2018 Intramuscular   Manufacturer: [place]   Lot: [id number]   [place]: [id number]

## 2019-10-26 ENCOUNTER — Other Ambulatory Visit: Payer: Self-pay

## 2019-10-26 ENCOUNTER — Ambulatory Visit (INDEPENDENT_AMBULATORY_CARE_PROVIDER_SITE_OTHER): Payer: Self-pay

## 2019-10-26 DIAGNOSIS — L578 Other skin changes due to chronic exposure to nonionizing radiation: Secondary | ICD-10-CM

## 2019-10-26 DIAGNOSIS — L730 Acne keloid: Secondary | ICD-10-CM

## 2019-10-26 DIAGNOSIS — Z872 Personal history of diseases of the skin and subcutaneous tissue: Secondary | ICD-10-CM

## 2019-10-26 NOTE — Progress Notes (Signed)
Skin Pen [place]. Forehead .075/0.05mm depth, Cheeks/Perioral 2.25/2.0/1.5 mm depth. Several passes. Pt tolerated well. JJ F/U for Halo in the fall. jj

## 2019-12-21 ENCOUNTER — Other Ambulatory Visit: Payer: Self-pay

## 2019-12-21 ENCOUNTER — Ambulatory Visit (INDEPENDENT_AMBULATORY_CARE_PROVIDER_SITE_OTHER): Payer: PRIVATE HEALTH INSURANCE | Admitting: Dermatology

## 2019-12-21 DIAGNOSIS — L821 Other seborrheic keratosis: Secondary | ICD-10-CM | POA: Diagnosis not present

## 2019-12-21 DIAGNOSIS — D229 Melanocytic nevi, unspecified: Secondary | ICD-10-CM | POA: Diagnosis not present

## 2019-12-21 DIAGNOSIS — D1800 Hemangioma unspecified site: Secondary | ICD-10-CM

## 2019-12-21 DIAGNOSIS — L820 Inflamed seborrheic keratosis: Secondary | ICD-10-CM

## 2019-12-21 DIAGNOSIS — Z85828 Personal history of other malignant neoplasm of skin: Secondary | ICD-10-CM | POA: Diagnosis not present

## 2019-12-21 DIAGNOSIS — L578 Other skin changes due to chronic exposure to nonionizing radiation: Secondary | ICD-10-CM

## 2019-12-21 DIAGNOSIS — L570 Actinic keratosis: Secondary | ICD-10-CM

## 2019-12-21 DIAGNOSIS — L739 Follicular disorder, unspecified: Secondary | ICD-10-CM | POA: Diagnosis not present

## 2019-12-21 MED ORDER — KETOCONAZOLE 2 % EX SHAM: MEDICATED_SHAMPOO | CUTANEOUS | 3 refills | Status: AC

## 2019-12-21 MED ORDER — CLINDAMYCIN PHOSPHATE 1 % EX GEL: CUTANEOUS | 3 refills | Status: DC

## 2019-12-21 NOTE — Progress Notes (Signed)
Follow-Up Visit   Subjective  Alexander Atkinson is a 59 y.o. male who presents for the following: Annual Exam (UBSE/legs/feet - hx BCC R post neck ). Patient has noticed lesions on the L thigh, and L side that are irregular and he would like checked.  One on L thigh is bothersome.  The following portions of the chart were reviewed this encounter and updated as appropriate:      Review of Systems:  No other skin or systemic complaints except as noted in HPI or Assessment and Plan.  Objective  Well appearing patient in no apparent distress; mood and affect are within normal limits.  A focused examination was performed including the upper body, legs and feet. Relevant physical exam findings are noted in the Assessment and Plan.  Objective  Left Flank, back: Stuck-on, waxy, tan-brown papule or plaque --Discussed benign etiology and prognosis.   Objective  Back and scalp: Follicular based papules  Objective  R post neck: No evidence of recurrence. Firm white hypertrophic scar.  Objective  R mid cheek x 2, L temple x 3 (5): Erythematous thin papules/macules with gritty scale.   Objective  L lat thigh x 1: Erythematous keratotic or waxy stuck-on papule or plaque.   Assessment & Plan  Seborrheic keratosis Left Flank, back  Reassured benign age-related growth.  Recommend observation.  Discussed cryotherapy if spot(s) become irritated or inflamed.    Folliculitis Back and scalp  Recommend OTC Panoxyl wash or CLN wash. Continue Clindamycin gel to aa's QD PRN and Ketoconazole 2% shampoo to the scalp twice per week. Let sit 5-10 minutes before washing off.   clindamycin (CLINDAGEL) 1 % gel - Back and scalp  ketoconazole (NIZORAL) 2 % shampoo - Back and scalp  History of basal cell carcinoma (BCC) R post neck  Clear. Observe for recurrence. Call clinic for new or changing lesions.  Recommend regular skin exams, daily broad-spectrum spf 30+ sunscreen use, and photoprotection.       AK (actinic keratosis) (5) R mid cheek x 2, L temple x 3  Destruction of lesion - R mid cheek x 2, L temple x 3 Complexity: simple   Destruction method: cryotherapy   Informed consent: discussed and consent obtained   Timeout:  patient name, date of birth, surgical site, and procedure verified Lesion destroyed using liquid nitrogen: Yes   Region frozen until ice ball extended beyond lesion: Yes   Outcome: patient tolerated procedure well with no complications   Post-procedure details: wound care instructions given    Inflamed seborrheic keratosis L lat thigh x 1  Destruction of lesion - L lat thigh x 1  Destruction method: cryotherapy   Informed consent: discussed and consent obtained   Lesion destroyed using liquid nitrogen: Yes   Region frozen until ice ball extended beyond lesion: Yes   Outcome: patient tolerated procedure well with no complications   Post-procedure details: wound care instructions given     Hemangiomas - Red papules - Discussed benign nature - Observe - Call for any changes  Lentigines - Scattered tan macules - Discussed due to sun exposure - Benign, observe - Call for any changes  Melanocytic Nevi - Tan-brown and/or pink-flesh-colored symmetric macules and papules - Benign appearing on exam today - Observation - Call clinic for new or changing moles - Recommend daily use of broad spectrum spf 30+ sunscreen to sun-exposed areas.   Actinic Damage - diffuse scaly erythematous macules with underlying dyspigmentation - Recommend daily broad spectrum sunscreen SPF  30+ to sun-exposed areas, reapply every 2 hours as needed.  - Call for new or changing lesions.  Return in about 6 months (around 06/22/2020).  Luther Redo, CMA, am acting as scribe for Brendolyn Patty, MD .  Documentation: I have reviewed the above documentation for accuracy and completeness, and I agree with the above.  Brendolyn Patty MD

## 2019-12-21 NOTE — Patient Instructions (Signed)

## 2020-04-04 ENCOUNTER — Other Ambulatory Visit: Payer: Self-pay

## 2020-04-04 ENCOUNTER — Ambulatory Visit (INDEPENDENT_AMBULATORY_CARE_PROVIDER_SITE_OTHER): Payer: PRIVATE HEALTH INSURANCE

## 2020-04-04 DIAGNOSIS — L570 Actinic keratosis: Secondary | ICD-10-CM

## 2020-04-04 DIAGNOSIS — L578 Other skin changes due to chronic exposure to nonionizing radiation: Secondary | ICD-10-CM

## 2020-04-12 ENCOUNTER — Other Ambulatory Visit: Payer: Self-pay

## 2020-04-12 ENCOUNTER — Ambulatory Visit (INDEPENDENT_AMBULATORY_CARE_PROVIDER_SITE_OTHER): Payer: PRIVATE HEALTH INSURANCE

## 2020-04-12 DIAGNOSIS — Z23 Encounter for immunization: Secondary | ICD-10-CM

## 2020-04-13 NOTE — Progress Notes (Signed)
Flu shot

## 2020-06-17 ENCOUNTER — Other Ambulatory Visit: Payer: Self-pay | Admitting: *Deleted

## 2020-06-17 DIAGNOSIS — E669 Obesity, unspecified: Secondary | ICD-10-CM

## 2020-06-17 DIAGNOSIS — Z125 Encounter for screening for malignant neoplasm of prostate: Secondary | ICD-10-CM

## 2020-06-17 DIAGNOSIS — E7800 Pure hypercholesterolemia, unspecified: Secondary | ICD-10-CM

## 2020-06-17 DIAGNOSIS — R7303 Prediabetes: Secondary | ICD-10-CM

## 2020-06-17 DIAGNOSIS — Z0000 Encounter for general adult medical examination without abnormal findings: Secondary | ICD-10-CM

## 2020-06-20 ENCOUNTER — Other Ambulatory Visit: Payer: PRIVATE HEALTH INSURANCE

## 2020-06-20 ENCOUNTER — Other Ambulatory Visit: Payer: Self-pay

## 2020-06-21 ENCOUNTER — Ambulatory Visit (INDEPENDENT_AMBULATORY_CARE_PROVIDER_SITE_OTHER): Payer: PRIVATE HEALTH INSURANCE | Admitting: Dermatology

## 2020-06-21 ENCOUNTER — Encounter: Payer: Self-pay | Admitting: Dermatology

## 2020-06-21 ENCOUNTER — Other Ambulatory Visit: Payer: Self-pay

## 2020-06-21 DIAGNOSIS — L578 Other skin changes due to chronic exposure to nonionizing radiation: Secondary | ICD-10-CM | POA: Diagnosis not present

## 2020-06-21 DIAGNOSIS — Z872 Personal history of diseases of the skin and subcutaneous tissue: Secondary | ICD-10-CM

## 2020-06-21 DIAGNOSIS — L820 Inflamed seborrheic keratosis: Secondary | ICD-10-CM

## 2020-06-21 DIAGNOSIS — Z1283 Encounter for screening for malignant neoplasm of skin: Secondary | ICD-10-CM | POA: Diagnosis not present

## 2020-06-21 DIAGNOSIS — L814 Other melanin hyperpigmentation: Secondary | ICD-10-CM

## 2020-06-21 DIAGNOSIS — L739 Follicular disorder, unspecified: Secondary | ICD-10-CM

## 2020-06-21 DIAGNOSIS — L821 Other seborrheic keratosis: Secondary | ICD-10-CM

## 2020-06-21 DIAGNOSIS — Z85828 Personal history of other malignant neoplasm of skin: Secondary | ICD-10-CM

## 2020-06-21 DIAGNOSIS — L905 Scar conditions and fibrosis of skin: Secondary | ICD-10-CM

## 2020-06-21 LAB — COMPLETE METABOLIC PANEL WITH GFR
AG Ratio: 1.2 (calc) (ref 1.0–2.5)
ALT: 27 U/L (ref 9–46)
AST: 21 U/L (ref 10–35)
Albumin: 4 g/dL (ref 3.6–5.1)
Alkaline phosphatase (APISO): 74 U/L (ref 35–144)
BUN/Creatinine Ratio: 19 (calc) (ref 6–22)
BUN: 12 mg/dL (ref 7–25)
CO2: 25 mmol/L (ref 20–32)
Calcium: 9 mg/dL (ref 8.6–10.3)
Chloride: 106 mmol/L (ref 98–110)
Creat: 0.64 mg/dL — ABNORMAL LOW (ref 0.70–1.25)
GFR, Est African American: 122 mL/min/{1.73_m2} (ref >=60)
GFR, Est Non African American: 106 mL/min/{1.73_m2} (ref >=60)
Globulin: 3.3 g/dL (calc) (ref 1.9–3.7)
Glucose, Bld: 92 mg/dL (ref 65–99)
Potassium: 4.4 mmol/L (ref 3.5–5.3)
Sodium: 139 mmol/L (ref 135–146)
Total Bilirubin: 0.8 mg/dL (ref 0.2–1.2)
Total Protein: 7.3 g/dL (ref 6.1–8.1)

## 2020-06-21 LAB — CBC WITH DIFFERENTIAL/PLATELET
Absolute Monocytes: 674 cells/uL (ref 200–950)
Basophils Absolute: 69 cells/uL (ref 0–200)
Basophils Relative: 1.1 %
Eosinophils Absolute: 176 cells/uL (ref 15–500)
Eosinophils Relative: 2.8 %
HCT: 45.6 % (ref 38.5–50.0)
Hemoglobin: 15.9 g/dL (ref 13.2–17.1)
Lymphs Abs: 2583 cells/uL (ref 850–3900)
MCH: 33.3 pg — ABNORMAL HIGH (ref 27.0–33.0)
MCHC: 34.9 g/dL (ref 32.0–36.0)
MCV: 95.6 fL (ref 80.0–100.0)
MPV: 11.1 fL (ref 7.5–12.5)
Monocytes Relative: 10.7 %
Neutro Abs: 2797 cells/uL (ref 1500–7800)
Neutrophils Relative %: 44.4 %
Platelets: 257 10*3/uL (ref 140–400)
RBC: 4.77 10*6/uL (ref 4.20–5.80)
RDW: 12.2 % (ref 11.0–15.0)
Total Lymphocyte: 41 %
WBC: 6.3 10*3/uL (ref 3.8–10.8)

## 2020-06-21 LAB — HEMOGLOBIN A1C
Hgb A1c MFr Bld: 6.1 % of total Hgb — ABNORMAL HIGH (ref <5.7)
Mean Plasma Glucose: 128 mg/dL
eAG (mmol/L): 7.1 mmol/L

## 2020-06-21 LAB — LIPID PANEL
Cholesterol: 200 mg/dL — ABNORMAL HIGH (ref <200)
HDL: 48 mg/dL (ref >=40)
LDL Cholesterol (Calc): 135 mg/dL (calc) — ABNORMAL HIGH
Non-HDL Cholesterol (Calc): 152 mg/dL (calc) — ABNORMAL HIGH (ref <130)
Total CHOL/HDL Ratio: 4.2 (calc) (ref <5.0)
Triglycerides: 75 mg/dL (ref <150)

## 2020-06-21 LAB — T4, FREE: Free T4: 1 ng/dL (ref 0.8–1.8)

## 2020-06-21 LAB — TSH: TSH: 3.15 mIU/L (ref 0.40–4.50)

## 2020-06-21 LAB — PSA: PSA: 0.41 ng/mL (ref <=4.0)

## 2020-06-21 NOTE — Progress Notes (Signed)
   Follow-Up Visit   Subjective  Alexander Atkinson is a 60 y.o. male who presents for the following: Follow-up (Patient presents today for a 6 month follow-up UBSE. He has a history of BCC of the right post neck and a history of AKs. He has a few spots to check on his right temple and chest, some sore.). He is self pay.   The following portions of the chart were reviewed this encounter and updated as appropriate:       Review of Systems:  No other skin or systemic complaints except as noted in HPI or Assessment and Plan.  Objective  Well appearing patient in no apparent distress; mood and affect are within normal limits.  A focused examination was performed including face, chest, scalp. Relevant physical exam findings are noted in the Assessment and Plan.  Objective  R temple, forehead (10 total) (10): Erythematous keratotic or waxy stuck-on papule   Objective  chest, back: Follicular crusted papule on lower sternum, neck; follicular papules on back, chest.   Assessment & Plan   Skin cancer screening performed today.  Actinic Damage - chronic, secondary to cumulative UV radiation exposure/sun exposure over time - diffuse scaly erythematous macules with underlying dyspigmentation - Recommend daily broad spectrum sunscreen SPF 30+ to sun-exposed areas, reapply every 2 hours as needed.  - Call for new or changing lesions.  History of Basal Cell Carcinoma of the Skin with Hypertrophic Scar - Thickened scar. No evidence of recurrence today - Recommend regular full body skin exams - Recommend daily broad spectrum sunscreen SPF 30+ to sun-exposed areas, reapply every 2 hours as needed.  - Call if any new or changing lesions are noted between office visits  Seborrheic Keratoses - Stuck-on, waxy, tan-brown papules and plaques  - Discussed benign etiology and prognosis. - Observe - Call for any changes  Lentigines - Scattered tan macules - Discussed due to sun exposure - Benign,  observe - Recommend daily broad spectrum sunscreen SPF 30+ to sun-exposed areas, reapply every 2 hours as needed. - Call for any changes   Inflamed seborrheic keratosis (10) R temple, forehead (10 total)  vs AKs   Destruction of lesion - R temple, forehead (10 total)  Destruction method: cryotherapy   Informed consent: discussed and consent obtained   Lesion destroyed using liquid nitrogen: Yes   Region frozen until ice ball extended beyond lesion: Yes   Outcome: patient tolerated procedure well with no complications   Post-procedure details: wound care instructions given   Additional details:  Prior to procedure, discussed risks of blister formation, small wound, skin dyspigmentation, or rare scar following cryotherapy.     Folliculitis chest, back  Improved.  Continue clindamycin gel qd/bid prn. Continue ketoconazole 2% shampoo to scalp/body  Other Related Medications clindamycin (CLINDAGEL) 1 % gel ketoconazole (NIZORAL) 2 % shampoo  Return in about 6 months (around 12/19/2020) for AKs, ISKs.   IJamesetta Orleans, CMA, am acting as scribe for Brendolyn Patty, MD .  Documentation: I have reviewed the above documentation for accuracy and completeness, and I agree with the above.  Brendolyn Patty MD

## 2020-06-21 NOTE — Patient Instructions (Signed)
Cryotherapy Aftercare  . Wash gently with soap and water everyday.   . Apply Vaseline and Band-Aid daily until healed.  

## 2020-06-27 ENCOUNTER — Other Ambulatory Visit: Payer: Self-pay | Admitting: Family Medicine

## 2020-06-27 ENCOUNTER — Other Ambulatory Visit: Payer: Self-pay

## 2020-06-27 ENCOUNTER — Ambulatory Visit (INDEPENDENT_AMBULATORY_CARE_PROVIDER_SITE_OTHER): Payer: PRIVATE HEALTH INSURANCE | Admitting: Family Medicine

## 2020-06-27 ENCOUNTER — Encounter: Payer: Self-pay | Admitting: Family Medicine

## 2020-06-27 VITALS — BP 134/84 | HR 62 | Ht 69.0 in | Wt 252.0 lb

## 2020-06-27 DIAGNOSIS — E7800 Pure hypercholesterolemia, unspecified: Secondary | ICD-10-CM

## 2020-06-27 DIAGNOSIS — Z0000 Encounter for general adult medical examination without abnormal findings: Secondary | ICD-10-CM | POA: Diagnosis not present

## 2020-06-27 DIAGNOSIS — R7303 Prediabetes: Secondary | ICD-10-CM

## 2020-06-27 DIAGNOSIS — E669 Obesity, unspecified: Secondary | ICD-10-CM

## 2020-06-27 DIAGNOSIS — Z125 Encounter for screening for malignant neoplasm of prostate: Secondary | ICD-10-CM

## 2020-06-27 NOTE — Assessment & Plan Note (Signed)
Low risk patient, with prior reported negative screening - Last PSA 0.4 (prior 1.1) - Last DRE few years ago reported normal - Clinically asymptomatic  Plan: 1. Reviewed prostate cancer screening guidelines and risks including potential prostate biopsy if abnormal PSA, proceed with yearly PSA for now, and anticipate DRE as needed especially if abnormal PSA or new symptoms

## 2020-06-27 NOTE — Assessment & Plan Note (Signed)
Elevated LDL up to 135, likely due to weight gain Last lipid panel 06/2020 The 10-year ASCVD risk score Mikey Bussing DC Jr., et al., 2013) is: 10.4%   Plan: 1. Not on statin. Prior discussed ASCVD risk. Offered Statin therapy - declined today. 2. Encourage improved lifestyle - low carb/cholesterol, reduce portion size, continue improving regular exercise

## 2020-06-27 NOTE — Assessment & Plan Note (Signed)
BMI >37 Weight up since last visit gain 20 lbs Encourage lifestyle modification resume exercise

## 2020-06-27 NOTE — Progress Notes (Signed)
Subjective:    Patient ID: Alexander Atkinson, male    DOB: 1960/10/30, 60 y.o.   MRN: [id number]  Alexander Atkinson is a 60 y.o. male presenting on 06/27/2020 for Annual Exam   HPI   Here for Annual Physical and Lab Review.  Pre-Diabetes/ Obesity BMI >37 A1c range in past 5.9 to 6.1, now last result A1c 6.1 CBGs:Does not check Meds: Never on meds Currentlynot on ACE/ARB Lifestyle: - Weight up 20 lbs, in past 1 year. Due to not exercising due to COVID restrictions - Diet (Improved diet) Denies hypoglycemia  Hyperlipidemia - Last visit with [id number] - Interval update controlled HDL and TG and TC, but with elevated LDL at 135 prior 100-120 range - Admits weight gan - Not on cholesterol medicine. Never on Statin therapy.  Constipation OTC Colon Health Capsules (Probiotic) for constipation and diarrhea symptoms. Doing well.   Health Maintenance  Due for COVID Vaccine Booster when ready. Completed Pfizer first 2 doses, 08/2019 and 09/2019.  Prostate CA Screening: Prior PSA / DRE reported normal. Last PSA0.41 (06/2020) previous result 1.1(05/2019). Currently asymptomaticwithoutBPH LUTS. No known family history of prostate CA.  Colon CA Screening: Last Colonoscopy7/2017(done by [facility]), results withno polyps, good for10years. Currently asymptomatic. No known family history of colon CA. Due for screening test    Depression screen [facility] 2/9 06/27/2020 12/11/2018 10/28/2018  Decreased Interest 0 0 0  Down, Depressed, Hopeless 0 0 0  PHQ - 2 Score 0 0 0  Altered sleeping - - -  Tired, decreased energy - - -  Change in appetite - - -  Feeling bad or failure about yourself  - - -  Trouble concentrating - - -  Moving slowly or fidgety/restless - - -  Suicidal thoughts - - -  PHQ-9 Score - - -  Difficult doing work/chores - - -    Past Medical History:  Diagnosis Date  . Actinic keratosis   . Basal cell carcinoma 09/28/2013   R neck posterior    Past  Surgical History:  Procedure Laterality Date  . APPENDECTOMY    . COLONOSCOPY WITH PROPOFOL N/A 11/22/2015   Procedure: COLONOSCOPY WITH PROPOFOL;  Surgeon: [name], MD;  Location: [facility] ENDOSCOPY;  Service: Endoscopy;  Laterality: N/A;   Social History   Socioeconomic History  . Marital status: Single    Spouse name: Not on file  . Number of children: Not on file  . Years of education: Not on file  . Highest education level: Not on file  Occupational History  . Not on file  Tobacco Use  . Smoking status: Never Smoker  . Smokeless tobacco: Never Used  Substance and Sexual Activity  . Alcohol use: No  . Drug use: No  . Sexual activity: Not on file  Other Topics Concern  . Not on file  Social History Narrative  . Not on file   Social Determinants of Health   Financial Resource Strain: Not on file  Food Insecurity: Not on file  Transportation Needs: Not on file  Physical Activity: Not on file  Stress: Not on file  Social Connections: Not on file  Intimate Partner Violence: Not on file   Family History  Problem Relation Age of Onset  . Thyroid disease Mother   . Kidney disease Maternal Grandfather   . Hypothyroidism Brother   . Hypothyroidism Brother   . Prostate cancer Neg Hx   . Colon cancer Neg Hx    Current Outpatient Medications on  File Prior to Visit  Medication Sig  . clindamycin (CLINDAGEL) 1 % gel Apply to aa's back and scalp QD PRN  . ketoconazole (NIZORAL) 2 % shampoo Shampoo into the scalp let sit 5-10 minutes before washing out. Use once to twice weekly.   No current facility-administered medications on file prior to visit.    Review of Systems  Constitutional: Negative for activity change, appetite change, chills, diaphoresis, fatigue and fever.  HENT: Negative for congestion and hearing loss.   Eyes: Negative for visual disturbance.  Respiratory: Negative for cough, chest tightness, shortness of breath and wheezing.   Cardiovascular: Negative  for chest pain, palpitations and leg swelling.  Gastrointestinal: Negative for abdominal pain, constipation, diarrhea, nausea and vomiting.  Endocrine: Negative for cold intolerance.  Genitourinary: Negative for difficulty urinating, dysuria, frequency and hematuria.  Musculoskeletal: Negative for arthralgias and neck pain.  Skin: Negative for rash.  Allergic/Immunologic: Negative for environmental allergies.  Neurological: Negative for dizziness, weakness, light-headedness, numbness and headaches.  Hematological: Negative for adenopathy.  Psychiatric/Behavioral: Negative for behavioral problems, dysphoric mood and sleep disturbance.   Per HPI unless specifically indicated above      Objective:    BP 134/84 (BP Location: Left Arm, Cuff Size: Normal)   Pulse 62   Ht 5\' 9"  (1.753 m)   Wt 252 lb (114.3 kg)   SpO2 99%   BMI 37.21 kg/m   Wt Readings from Last 3 Encounters:  06/27/20 252 lb (114.3 kg)  06/25/19 229 lb (103.9 kg)  12/11/18 242 lb (109.8 kg)    Physical Exam Vitals and nursing note reviewed.  Constitutional:      General: He is not in acute distress.    Appearance: He is well-developed and well-nourished. He is obese. He is not diaphoretic.     Comments: Well-appearing, comfortable, cooperative  HENT:     Head: Normocephalic and atraumatic.     Mouth/Throat:     Mouth: Oropharynx is clear and moist.  Eyes:     General:        Right eye: No discharge.        Left eye: No discharge.     Extraocular Movements: EOM normal.     Conjunctiva/sclera: Conjunctivae normal.     Pupils: Pupils are equal, round, and reactive to light.  Neck:     Thyroid: No thyromegaly.     Vascular: No carotid bruit.  Cardiovascular:     Rate and Rhythm: Normal rate and regular rhythm.     Pulses: Intact distal pulses.     Heart sounds: Normal heart sounds. No murmur heard.   Pulmonary:     Effort: Pulmonary effort is normal. No respiratory distress.     Breath sounds: Normal  breath sounds. No wheezing or rales.  Abdominal:     General: Bowel sounds are normal. There is no distension.     Palpations: Abdomen is soft. There is no mass.     Tenderness: There is no abdominal tenderness.  Musculoskeletal:        General: No tenderness or edema. Normal range of motion.     Cervical back: Normal range of motion and neck supple.     Right lower leg: No edema.     Left lower leg: No edema.     Comments: Upper / Lower Extremities: - Normal muscle tone, strength bilateral upper extremities 5/5, lower extremities 5/5  Lymphadenopathy:     Cervical: No cervical adenopathy.  Skin:    General: Skin is  warm and dry.     Findings: No erythema or rash.  Neurological:     Mental Status: He is alert and oriented to person, place, and time.     Comments: Distal sensation intact to light touch all extremities  Psychiatric:        Mood and Affect: Mood and affect normal.        Behavior: Behavior normal.     Comments: Well groomed, good eye contact, normal speech and thoughts       Results for orders placed or performed in visit on 06/17/20  T4, free  Result Value Ref Range   Free T4 1.0 0.8 - 1.8 ng/dL  TSH  Result Value Ref Range   TSH 3.15 0.40 - 4.50 mIU/L  PSA  Result Value Ref Range   PSA 0.41 < OR = 4.0 ng/mL  Lipid panel  Result Value Ref Range   Cholesterol 200 (H) <200 mg/dL   HDL 48 > OR = 40 mg/dL   Triglycerides 75 <150 mg/dL   LDL Cholesterol (Calc) 135 (H) mg/dL (calc)   Total CHOL/HDL Ratio 4.2 <5.0 (calc)   Non-HDL Cholesterol (Calc) 152 (H) <130 mg/dL (calc)  COMPLETE METABOLIC PANEL WITH GFR  Result Value Ref Range   Glucose, Bld 92 65 - 99 mg/dL   BUN 12 7 - 25 mg/dL   Creat 0.64 (L) 0.70 - 1.25 mg/dL   GFR, Est Non African American 106 > OR = 60 mL/min/1.5m2   GFR, Est African American 122 > OR = 60 mL/min/1.25m2   BUN/Creatinine Ratio 19 6 - 22 (calc)   Sodium 139 135 - 146 mmol/L   Potassium 4.4 3.5 - 5.3 mmol/L   Chloride 106 98  - 110 mmol/L   CO2 25 20 - 32 mmol/L   Calcium 9.0 8.6 - 10.3 mg/dL   Total Protein 7.3 6.1 - 8.1 g/dL   Albumin 4.0 3.6 - 5.1 g/dL   Globulin 3.3 1.9 - 3.7 g/dL (calc)   AG Ratio 1.2 1.0 - 2.5 (calc)   Total Bilirubin 0.8 0.2 - 1.2 mg/dL   Alkaline phosphatase (APISO) 74 35 - 144 U/L   AST 21 10 - 35 U/L   ALT 27 9 - 46 U/L  CBC with Differential/Platelet  Result Value Ref Range   WBC 6.3 3.8 - 10.8 Thousand/uL   RBC 4.77 4.20 - 5.80 Million/uL   Hemoglobin 15.9 13.2 - 17.1 g/dL   HCT 45.6 38.5 - 50.0 %   MCV 95.6 80.0 - 100.0 fL   MCH 33.3 (H) 27.0 - 33.0 pg   MCHC 34.9 32.0 - 36.0 g/dL   RDW 12.2 11.0 - 15.0 %   Platelets 257 140 - 400 Thousand/uL   MPV 11.1 7.5 - 12.5 fL   Neutro Abs 2,797 1,500 - 7,800 cells/uL   Lymphs Abs 2,583 850 - 3,900 cells/uL   Absolute Monocytes 674 200 - 950 cells/uL   Eosinophils Absolute 176 15 - 500 cells/uL   Basophils Absolute 69 0 - 200 cells/uL   Neutrophils Relative % 44.4 %   Total Lymphocyte 41.0 %   Monocytes Relative 10.7 %   Eosinophils Relative 2.8 %   Basophils Relative 1.1 %  Hemoglobin A1c  Result Value Ref Range   Hgb A1c MFr Bld 6.1 (H) <5.7 % of total Hgb   Mean Plasma Glucose 128 mg/dL   eAG (mmol/L) 7.1 mmol/L      Assessment & Plan:   Problem List Items Addressed  This Visit    Screening for prostate cancer    Low risk patient, with prior reported negative screening - Last PSA 0.4 (prior 1.1) - Last DRE few years ago reported normal - Clinically asymptomatic  Plan: 1. Reviewed prostate cancer screening guidelines and risks including potential prostate biopsy if abnormal PSA, proceed with yearly PSA for now, and anticipate DRE as needed especially if abnormal PSA or new symptoms      Pure hypercholesterolemia    Elevated LDL up to 135, likely due to weight gain Last lipid panel 06/2020 The 10-year ASCVD risk score Mikey Bussing DC Jr., et al., 2013) is: 10.4%   Plan: 1. Not on statin. Prior discussed ASCVD risk.  Offered Statin therapy - declined today. 2. Encourage improved lifestyle - low carb/cholesterol, reduce portion size, continue improving regular exercise      Pre-diabetes    A1c up to 6.1, prior 5.9 to 6.1 range Concern with obesity (improving wt loss), HLD  Plan:  1. Not on any therapy currently 2. Encourage improved lifestyle - low carb, low sugar diet, reduce portion size, regular exercise      Obesity (BMI 35.0-39.9 without comorbidity)    BMI >37 Weight up since last visit gain 20 lbs Encourage lifestyle modification resume exercise       Other Visit Diagnoses    Annual physical exam    -  Primary      Updated Health Maintenance information - Next Colonoscopy 11/2025 - PSA Negative, check yearly Reviewed recent lab results with patient Encouraged improvement to lifestyle with diet and exercise - Goal of weight loss   No orders of the defined types were placed in this encounter.    Follow up plan: Return in about 1 year (around 06/27/2021) for 1 year fasting lab only then 1 week later Annual Physical.   Future labs 06/22/21  Nobie Putnam, [facility] 06/27/2020, 9:32 AM

## 2020-06-27 NOTE — Patient Instructions (Addendum)
Thank you for coming to the office today.  Keep track of BP readings if possible to monitor this between visits.  1. Chemistry - Normal results, including electrolytes, kidney and liver function. Normal fasting blood sugar   2. Hemoglobin A1c (Diabetes screening) - 6.1, in range of Pre-Diabetes (>5.7 to 6.4)   3. PSA Prostate Cancer Screening - 0.41, negative.  4. TSH Thyroid Function Tests - Normal.  5. Cholesterol - Elevated LDL at 135, otherwise normal results. - Future consider Statin rx cholesterol med, we can review next year, goal to lose weight to help this.  6. CBC Blood Counts - Normal, no anemia, other abnormality  Next Colonoscopy 11/2025  DUE for FASTING BLOOD WORK (no food or drink after midnight before the lab appointment, only water or coffee without cream/sugar on the morning of)  SCHEDULE "Lab Only" visit in the morning at the clinic for lab draw in 1 YEAR  - Make sure Lab Only appointment is at about 1 week before your next appointment, so that results will be available  For Lab Results, once available within 2-3 days of blood draw, you can can log in to MyChart online to view your results and a brief explanation. Also, we can discuss results at next follow-up visit.   Please schedule a Follow-up Appointment to: Return in about 1 year (around 06/27/2021) for 1 year fasting lab only then 1 week later Annual Physical.  If you have any other questions or concerns, please feel free to call the office or send a message through [place]. You may also schedule an earlier appointment if necessary.  Additionally, you may be receiving a survey about your experience at our office within a few days to 1 week by e-mail or mail. We value your feedback.  Nobie Putnam, DO [place]

## 2020-06-27 NOTE — Assessment & Plan Note (Signed)
A1c up to 6.1, prior 5.9 to 6.1 range Concern with obesity (improving wt loss), HLD  Plan:  1. Not on any therapy currently 2. Encourage improved lifestyle - low carb, low sugar diet, reduce portion size, regular exercise

## 2020-12-20 ENCOUNTER — Ambulatory Visit: Payer: PRIVATE HEALTH INSURANCE | Admitting: Dermatology

## 2021-01-19 ENCOUNTER — Ambulatory Visit (INDEPENDENT_AMBULATORY_CARE_PROVIDER_SITE_OTHER): Payer: PRIVATE HEALTH INSURANCE | Admitting: Dermatology

## 2021-01-19 ENCOUNTER — Other Ambulatory Visit: Payer: Self-pay

## 2021-01-19 DIAGNOSIS — L910 Hypertrophic scar: Secondary | ICD-10-CM | POA: Diagnosis not present

## 2021-01-19 DIAGNOSIS — L820 Inflamed seborrheic keratosis: Secondary | ICD-10-CM

## 2021-01-19 DIAGNOSIS — L814 Other melanin hyperpigmentation: Secondary | ICD-10-CM

## 2021-01-19 DIAGNOSIS — L821 Other seborrheic keratosis: Secondary | ICD-10-CM

## 2021-01-19 DIAGNOSIS — Z85828 Personal history of other malignant neoplasm of skin: Secondary | ICD-10-CM

## 2021-01-19 DIAGNOSIS — Z872 Personal history of diseases of the skin and subcutaneous tissue: Secondary | ICD-10-CM

## 2021-01-19 DIAGNOSIS — L578 Other skin changes due to chronic exposure to nonionizing radiation: Secondary | ICD-10-CM

## 2021-01-19 DIAGNOSIS — L739 Follicular disorder, unspecified: Secondary | ICD-10-CM | POA: Diagnosis not present

## 2021-01-19 NOTE — Patient Instructions (Addendum)
Instructions for [facility] Medications  One or more of your medications was sent to the [facility] mail order compounding pharmacy. You will receive an email from them and can purchase the medicine through that link. It will then be mailed to your home at the address you confirmed. If for any reason you do not receive an email from them, please check your spam folder. If you still do not find the email, please let us know. [facility] phone number is [phone].   5-fluorouracil/calcipotriene cream is is a type of field treatment used to treat precancers, thin skin cancers, and areas of sun damage. Reviewed expected reaction including irritation and mild inflammation potentially progressing to more severe inflammation including redness, scaling, crusting and open sores/erosions.  Reviewed if too much irritation occurs, ensure application of only a thin layer and decrease frequency of use to achieve a tolerable level of inflammation. Recommend applying Vaseline ointment to open sores as needed.  Minimize sun exposure while under treatment. Recommend daily broad spectrum sunscreen SPF 30+ to sun-exposed areas, reapply every 2 hours as needed.   Cryotherapy Aftercare  Wash gently with soap and water everyday.   Apply Vaseline and Band-Aid daily until healed.    If you have any questions or concerns for your doctor, please call our main line at [phone] and press option 4 to reach your doctor's medical assistant. If no one answers, please leave a voicemail as directed and we will return your call as soon as possible. Messages left after 4 pm will be answered the following business day.   You may also send [place] a message via [place]. We typically respond to MyChart messages within 1-2 business days.  For prescription refills, please ask your pharmacy to contact our office. Our fax number is [phone].  If you have an urgent issue when the clinic is closed that cannot wait until the  next business day, you can page your doctor at the number below.    Please note that while we do our best to be available for urgent issues outside of office hours, we are not available 24/7.   If you have an urgent issue and are unable to reach [place], you may choose to seek medical care at your doctor's office, retail clinic, urgent care center, or emergency room.  If you have a medical emergency, please immediately call 911 or go to the emergency department.  Pager Numbers  - Dr. Nehemiah Massed: [phone]  - Dr. Laurence Ferrari: [phone]  - Dr. Nicole Kindred: [phone]  In the event of inclement weather, please call our main line at [phone] for an update on the status of any delays or closures.  Dermatology Medication Tips: Please keep the boxes that topical medications come in in order to help keep track of the instructions about where and how to use these. Pharmacies typically print the medication instructions only on the boxes and not directly on the medication tubes.   If your medication is too expensive, please contact our office at [phone] option 4 or send [place] a message through [place].   We are unable to tell what your co-pay for medications will be in advance as this is different depending on your insurance coverage. However, we may be able to find a substitute medication at lower cost or fill out paperwork to get insurance to cover a needed medication.   If a prior authorization is required to get your medication covered by your insurance company, please allow [place] 1-2 business days to  complete this process.  Drug prices often vary depending on where the prescription is filled and some pharmacies may offer cheaper prices.  The website www.goodrx.com contains coupons for medications through different pharmacies. The prices here do not account for what the cost may be with help from insurance (it may be cheaper with your insurance), but the website can give you the price if you did not  use any insurance.  - You can print the associated coupon and take it with your prescription to the pharmacy.  - You may also stop by our office during regular business hours and pick up a GoodRx coupon card.  - If you need your prescription sent electronically to a different pharmacy, notify our office through [facility] or by phone at [phone] option 4.

## 2021-01-19 NOTE — Progress Notes (Signed)
Follow-Up Visit   Subjective  Alexander Atkinson is a 60 y.o. male who presents for the following: Follow up (Patient here for f/u Aks and ISKs. He has a few spots on his face he would like checked. He has a history of BCC of the R neck posterior and a history of AKs.).   The following portions of the chart were reviewed this encounter and updated as appropriate:       Review of Systems:  No other skin or systemic complaints except as noted in HPI or Assessment and Plan.  Objective  Well appearing patient in no apparent distress; mood and affect are within normal limits.  A focused examination was performed including face, scalp, trunk, arms. Relevant physical exam findings are noted in the Assessment and Plan.  Right Posterior Neck Hypertrophic scar in area of BCC EDC site, clear  chest, hairline Follicular-based pink papules  Left Mid Back x 1, Right Flank x 1 (2) Erythematous keratotic or waxy stuck-on papule  L zygoma x 3, L preauricular x 1, R frontal scalp x 1, L forehead x 1, L frontal scalp x 5, R lat eyebrow x 1, R sideburn x 1, nasal dorsum x 2, R crown x 1, inf vertex x 1, crown x 1 (18) Pink keratotic macules/papules   Assessment & Plan  Actinic Damage - Severe, confluent actinic changes with pre-cancerous actinic keratoses  - Severe, chronic, not at goal, secondary to cumulative UV radiation exposure over time - diffuse scaly erythematous macules and papules with underlying dyspigmentation - Discussed Prescription "Field Treatment" for Severe, Chronic Confluent Actinic Changes with Pre-Cancerous Actinic Keratoses Field treatment involves treatment of an entire area of skin that has confluent Actinic Changes (Sun/ Ultraviolet light damage) and PreCancerous Actinic Keratoses by method of PhotoDynamic Therapy (PDT) and/or prescription Topical Chemotherapy agents such as 5-fluorouracil, 5-fluorouracil/calcipotriene, and/or imiquimod.  The purpose is to decrease the number of  clinically evident and subclinical PreCancerous lesions to prevent progression to development of skin cancer by chemically destroying early precancer changes that may or may not be visible.  It has been shown to reduce the risk of developing skin cancer in the treated area. As a result of treatment, redness, scaling, crusting, and open sores may occur during treatment course. One or more than one of these methods may be used and may have to be used several times to control, suppress and eliminate the PreCancerous changes. Discussed treatment course, expected reaction, and possible side effects. - Recommend daily broad spectrum sunscreen SPF 30+ to sun-exposed areas, reapply every 2 hours as needed.  - Staying in the shade or wearing long sleeves, sun glasses (UVA+UVB protection) and wide brim hats (4-inch brim around the entire circumference of the hat) are also recommended. - Call for new or changing lesions. - Start Fluorouracil: 5%, Calcipotriene: 0.005% Cream Apply BID forehead, temples, nose x 7-10 days face, 10-14 days to the scalp  Seborrheic Keratoses - Stuck-on, waxy, tan-brown papules and/or plaques  - Benign-appearing - Discussed benign etiology and prognosis. - Observe - Call for any changes  Lentigines - Scattered tan macules - Due to sun exposure - Benign-appering, observe - Recommend daily broad spectrum sunscreen SPF 30+ to sun-exposed areas, reapply every 2 hours as needed. - Call for any changes  History of Basal Cell Carcinoma of the Skin - No evidence of recurrence today - Recommend regular full body skin exams - Recommend daily broad spectrum sunscreen SPF 30+ to sun-exposed areas, reapply every 2 hours  as needed.  - Call if any new or changing lesions are noted between office visits   Hypertrophic scar Right Posterior Neck  Due to treated [place] by EDC- no recurrence  Discussed IL injection if becomes bothersome. Patient defers today.   Folliculitis chest,  hairline  Continue clindamycin gel qd/bid prn. Continue ketoconazole 2% shampoo to scalp/body  Related Medications clindamycin (CLINDAGEL) 1 % gel Apply to aa's back and scalp QD PRN  ketoconazole (NIZORAL) 2 % shampoo Shampoo into the scalp let sit 5-10 minutes before washing out. Use once to twice weekly.  Inflamed seborrheic keratosis Left Mid Back x 1, Right Flank x 1 (2); L zygoma x 3, L preauricular x 1, R frontal scalp x 1, L forehead x 1, L frontal scalp x 5, R lat eyebrow x 1, R sideburn x 1, nasal dorsum x 2, R crown x 1, inf vertex x 1, crown x 1 (18)  vs HyAKs    Destruction of lesion - L zygoma x 3, L preauricular x 1, R frontal scalp x 1, L forehead x 1, L frontal scalp x 5, R lat eyebrow x 1, R sideburn x 1, nasal dorsum x 2, R crown x 1, inf vertex x 1, crown x 1, Left Mid Back x 1, Right Flank x 1  Destruction method: cryotherapy   Informed consent: discussed and consent obtained   Lesion destroyed using liquid nitrogen: Yes   Region frozen until ice ball extended beyond lesion: Yes   Outcome: patient tolerated procedure well with no complications   Post-procedure details: wound care instructions given   Additional details:  Prior to procedure, discussed risks of blister formation, small wound, skin dyspigmentation, or rare scar following cryotherapy. Recommend Vaseline ointment to treated areas while healing.   Return in about 6 months (around 07/19/2021) for AKs/ISKs.  IJamesetta Orleans, CMA, am acting as scribe for Brendolyn Patty, MD .   Documentation: I have reviewed the above documentation for accuracy and completeness, and I agree with the above.  Brendolyn Patty MD

## 2021-04-26 ENCOUNTER — Other Ambulatory Visit: Payer: Self-pay

## 2021-04-26 ENCOUNTER — Ambulatory Visit (INDEPENDENT_AMBULATORY_CARE_PROVIDER_SITE_OTHER): Payer: PRIVATE HEALTH INSURANCE

## 2021-04-26 VITALS — Wt 252.0 lb

## 2021-04-26 DIAGNOSIS — Z23 Encounter for immunization: Secondary | ICD-10-CM | POA: Diagnosis not present

## 2021-06-22 ENCOUNTER — Other Ambulatory Visit: Payer: PRIVATE HEALTH INSURANCE

## 2021-06-22 DIAGNOSIS — R7303 Prediabetes: Secondary | ICD-10-CM

## 2021-06-22 DIAGNOSIS — E669 Obesity, unspecified: Secondary | ICD-10-CM

## 2021-06-22 DIAGNOSIS — E7800 Pure hypercholesterolemia, unspecified: Secondary | ICD-10-CM

## 2021-06-22 DIAGNOSIS — Z125 Encounter for screening for malignant neoplasm of prostate: Secondary | ICD-10-CM

## 2021-06-22 DIAGNOSIS — Z0000 Encounter for general adult medical examination without abnormal findings: Secondary | ICD-10-CM

## 2021-06-23 LAB — LIPID PANEL
Cholesterol: 175 mg/dL (ref <200)
HDL: 46 mg/dL (ref >=40)
LDL Cholesterol (Calc): 110 mg/dL (calc) — ABNORMAL HIGH
Non-HDL Cholesterol (Calc): 129 mg/dL (calc) (ref <130)
Total CHOL/HDL Ratio: 3.8 (calc) (ref <5.0)
Triglycerides: 93 mg/dL (ref <150)

## 2021-06-23 LAB — CBC WITH DIFFERENTIAL/PLATELET
Absolute Monocytes: 594 cells/uL (ref 200–950)
Basophils Absolute: 60 cells/uL (ref 0–200)
Basophils Relative: 1 %
Eosinophils Absolute: 138 cells/uL (ref 15–500)
Eosinophils Relative: 2.3 %
HCT: 45.9 % (ref 38.5–50.0)
Hemoglobin: 16 g/dL (ref 13.2–17.1)
Lymphs Abs: 2586 cells/uL (ref 850–3900)
MCH: 34.6 pg — ABNORMAL HIGH (ref 27.0–33.0)
MCHC: 34.9 g/dL (ref 32.0–36.0)
MCV: 99.1 fL (ref 80.0–100.0)
MPV: 10.7 fL (ref 7.5–12.5)
Monocytes Relative: 9.9 %
Neutro Abs: 2622 cells/uL (ref 1500–7800)
Neutrophils Relative %: 43.7 %
Platelets: 265 10*3/uL (ref 140–400)
RBC: 4.63 10*6/uL (ref 4.20–5.80)
RDW: 12.3 % (ref 11.0–15.0)
Total Lymphocyte: 43.1 %
WBC: 6 10*3/uL (ref 3.8–10.8)

## 2021-06-23 LAB — COMPLETE METABOLIC PANEL WITH GFR
AG Ratio: 1.3 (calc) (ref 1.0–2.5)
ALT: 24 U/L (ref 9–46)
AST: 23 U/L (ref 10–35)
Albumin: 4 g/dL (ref 3.6–5.1)
Alkaline phosphatase (APISO): 69 U/L (ref 35–144)
BUN: 13 mg/dL (ref 7–25)
CO2: 26 mmol/L (ref 20–32)
Calcium: 9 mg/dL (ref 8.6–10.3)
Chloride: 107 mmol/L (ref 98–110)
Creat: 0.85 mg/dL (ref 0.70–1.35)
Globulin: 3.1 g/dL (calc) (ref 1.9–3.7)
Glucose, Bld: 108 mg/dL — ABNORMAL HIGH (ref 65–99)
Potassium: 4.4 mmol/L (ref 3.5–5.3)
Sodium: 140 mmol/L (ref 135–146)
Total Bilirubin: 0.9 mg/dL (ref 0.2–1.2)
Total Protein: 7.1 g/dL (ref 6.1–8.1)
eGFR: 98 mL/min/{1.73_m2} (ref >=60)

## 2021-06-23 LAB — TSH: TSH: 3.79 mIU/L (ref 0.40–4.50)

## 2021-06-23 LAB — HEMOGLOBIN A1C
Hgb A1c MFr Bld: 6.2 % of total Hgb — ABNORMAL HIGH (ref <5.7)
Mean Plasma Glucose: 131 mg/dL
eAG (mmol/L): 7.3 mmol/L

## 2021-06-23 LAB — T4, FREE: Free T4: 1.1 ng/dL (ref 0.8–1.8)

## 2021-06-23 LAB — PSA: PSA: 0.54 ng/mL (ref <=4.00)

## 2021-06-29 ENCOUNTER — Encounter: Payer: PRIVATE HEALTH INSURANCE | Admitting: Family Medicine

## 2021-07-05 ENCOUNTER — Ambulatory Visit (INDEPENDENT_AMBULATORY_CARE_PROVIDER_SITE_OTHER): Payer: PRIVATE HEALTH INSURANCE | Admitting: Family Medicine

## 2021-07-05 ENCOUNTER — Encounter: Payer: Self-pay | Admitting: Family Medicine

## 2021-07-05 ENCOUNTER — Other Ambulatory Visit: Payer: Self-pay

## 2021-07-05 VITALS — BP 130/75 | HR 93 | Ht 69.0 in | Wt 252.4 lb

## 2021-07-05 DIAGNOSIS — Z0000 Encounter for general adult medical examination without abnormal findings: Secondary | ICD-10-CM | POA: Diagnosis not present

## 2021-07-05 DIAGNOSIS — E669 Obesity, unspecified: Secondary | ICD-10-CM

## 2021-07-05 DIAGNOSIS — E7800 Pure hypercholesterolemia, unspecified: Secondary | ICD-10-CM | POA: Diagnosis not present

## 2021-07-05 DIAGNOSIS — R7303 Prediabetes: Secondary | ICD-10-CM

## 2021-07-05 NOTE — Patient Instructions (Addendum)
Thank you for coming to the office today.  1. Chemistry - Normal results, including electrolytes, kidney and liver function. Slightly elevated fasting blood sugar    2. Hemoglobin A1c (Diabetes screening) - 6.2, similar to prior 6.1, but slight increase = in range of Pre-Diabetes (>5.7 to 6.4)    3. PSA Prostate Cancer Screening - 0.54, negative.   4. TSH Thyroid Function Tests - Normal.   5. Cholesterol - Improved cholesterol result. LDL down to 110 from 135.   6. CBC Blood Counts - Normal, no anemia, other abnormality  ---------------------------------------------------------  DUE for FASTING BLOOD WORK (no food or drink after midnight before the lab appointment, only water or coffee without cream/sugar on the morning of)  SCHEDULE "Lab Only" visit in the morning at the clinic for lab draw in 1 YEAR  - Make sure Lab Only appointment is at about 1 week before your next appointment, so that results will be available  For Lab Results, once available within 2-3 days of blood draw, you can can log in to MyChart online to view your results and a brief explanation. Also, we can discuss results at next follow-up visit.   Please schedule a Follow-up Appointment to: Return in about 1 year (around 07/05/2022) for 1 year fasting lab only then 1 week later Annual Physical.  If you have any other questions or concerns, please feel free to call the office or send a message through [place]. You may also schedule an earlier appointment if necessary.  Additionally, you may be receiving a survey about your experience at our office within a few days to 1 week by e-mail or mail. We value your feedback.  Alexander Putnam, DO [place]

## 2021-07-05 NOTE — Progress Notes (Signed)
Subjective:    Patient ID: Alexander Atkinson, male    DOB: [birth year], 62 y.o.   MRN: [id number]  Alexander Atkinson is a 61 y.o. male presenting on 07/05/2021 for Annual Exam   HPI  Here for Annual Physical and Lab Review.   Pre-Diabetes / Obesity BMI >37 A1c up to 6.2, prior 5.9 to 6.1 CBGs: Does not check Meds: Never on meds Currently not on ACE/ARB Lifestyle: Weight unchanged 1 yr  - Diet (Improved diet) Denies hypoglycemia   Hyperlipidemia Last lab 06/2021 with LDL down from 135 to 110 - Not on cholesterol medicine. Never on Statin therapy.  The 10-year ASCVD risk score (Arnett DK, et al., 2019) is: 9.8%    Constipation OTC Colon Health Capsules (Probiotic) for constipation and diarrhea symptoms. Doing well.      Health Maintenance   Due for COVID Vaccine Booster when ready. Completed Pfizer first 2 doses, 08/2019 and 09/2019.   Prostate CA Screening: Prior PSA / DRE reported normal. Last PSA 0.54 (06/2021) previous result 0.41 and 0.1 Currently asymptomatic without BPH LUTS. No known family history of prostate CA.    Colon CA Screening: Last Colonoscopy 11/2015 (done by [facility] Dr Allen Norris), results with no polyps, good for 10 years. Currently asymptomatic. No known family history of colon CA. Due for screening test    Depression screen [facility] 2/9 07/05/2021 06/27/2020 12/11/2018  Decreased Interest 0 0 0  Down, Depressed, Hopeless 0 0 0  PHQ - 2 Score 0 0 0  Altered sleeping 1 - -  Tired, decreased energy 1 - -  Change in appetite 0 - -  Feeling bad or failure about yourself  0 - -  Trouble concentrating 0 - -  Moving slowly or fidgety/restless 0 - -  Suicidal thoughts 0 - -  PHQ-9 Score 2 - -  Difficult doing work/chores Not difficult at all - -    Past Medical History:  Diagnosis Date   Actinic keratosis    Basal cell carcinoma 09/28/2013   R neck posterior    Past Surgical History:  Procedure Laterality Date   APPENDECTOMY     COLONOSCOPY WITH PROPOFOL N/A  11/22/2015   Procedure: COLONOSCOPY WITH PROPOFOL;  Surgeon: [name], MD;  Location: [facility] ENDOSCOPY;  Service: Endoscopy;  Laterality: N/A;   Social History   Socioeconomic History   Marital status: Single    Spouse name: Not on file   Number of children: Not on file   Years of education: Not on file   Highest education level: Not on file  Occupational History   Not on file  Tobacco Use   Smoking status: Never   Smokeless tobacco: Never  Substance and Sexual Activity   Alcohol use: No   Drug use: No   Sexual activity: Not on file  Other Topics Concern   Not on file  Social History Narrative   Not on file   Social Determinants of Health   Financial Resource Strain: Not on file  Food Insecurity: Not on file  Transportation Needs: Not on file  Physical Activity: Not on file  Stress: Not on file  Social Connections: Not on file  Intimate Partner Violence: Not on file   Family History  Problem Relation Age of Onset   Thyroid disease Mother    Kidney disease Maternal Grandfather    Hypothyroidism Brother    Hypothyroidism Brother    Prostate cancer Neg Hx    Colon cancer Neg Hx  Current Outpatient Medications on File Prior to Visit  Medication Sig   clindamycin (CLINDAGEL) 1 % gel Apply to aa's back and scalp QD PRN   ketoconazole (NIZORAL) 2 % shampoo Shampoo into the scalp let sit 5-10 minutes before washing out. Use once to twice weekly.   No current facility-administered medications on file prior to visit.    Review of Systems  Constitutional:  Negative for activity change, appetite change, chills, diaphoresis, fatigue and fever.  HENT:  Negative for congestion and hearing loss.   Eyes:  Negative for visual disturbance.  Respiratory:  Negative for cough, chest tightness, shortness of breath and wheezing.   Cardiovascular:  Negative for chest pain, palpitations and leg swelling.  Gastrointestinal:  Negative for abdominal pain, constipation, diarrhea, nausea  and vomiting.  Genitourinary:  Negative for dysuria, frequency and hematuria.  Musculoskeletal:  Negative for arthralgias and neck pain.  Skin:  Negative for rash.  Neurological:  Negative for dizziness, weakness, light-headedness, numbness and headaches.  Hematological:  Negative for adenopathy.  Psychiatric/Behavioral:  Negative for behavioral problems, dysphoric mood and sleep disturbance.   Per HPI unless specifically indicated above      Objective:    BP 130/75    Pulse 93    Ht $R'5\' 9"'aS$  (1.753 m)    Wt 252 lb 6.4 oz (114.5 kg)    SpO2 98%    BMI 37.27 kg/m   Wt Readings from Last 3 Encounters:  07/05/21 252 lb 6.4 oz (114.5 kg)  04/26/21 252 lb (114.3 kg)  06/27/20 252 lb (114.3 kg)    Physical Exam Vitals and nursing note reviewed.  Constitutional:      General: He is not in acute distress.    Appearance: He is well-developed. He is not diaphoretic.     Comments: Well-appearing, comfortable, cooperative  HENT:     Head: Normocephalic and atraumatic.  Eyes:     General:        Right eye: No discharge.        Left eye: No discharge.     Conjunctiva/sclera: Conjunctivae normal.     Pupils: Pupils are equal, round, and reactive to light.  Neck:     Thyroid: No thyromegaly.  Cardiovascular:     Rate and Rhythm: Normal rate and regular rhythm.     Pulses: Normal pulses.     Heart sounds: Normal heart sounds. No murmur heard. Pulmonary:     Effort: Pulmonary effort is normal. No respiratory distress.     Breath sounds: Normal breath sounds. No wheezing or rales.  Abdominal:     General: Bowel sounds are normal. There is no distension.     Palpations: Abdomen is soft. There is no mass.     Tenderness: There is no abdominal tenderness.  Musculoskeletal:        General: No tenderness. Normal range of motion.     Cervical back: Normal range of motion and neck supple.     Comments: Upper / Lower Extremities: - Normal muscle tone, strength bilateral upper extremities 5/5,  lower extremities 5/5  Lymphadenopathy:     Cervical: No cervical adenopathy.  Skin:    General: Skin is warm and dry.     Findings: No erythema or rash.  Neurological:     Mental Status: He is alert and oriented to person, place, and time.     Comments: Distal sensation intact to light touch all extremities  Psychiatric:        Mood and Affect:  Mood normal.        Behavior: Behavior normal.        Thought Content: Thought content normal.     Comments: Well groomed, good eye contact, normal speech and thoughts     Results for orders placed or performed in visit on 06/22/21  T4, free  Result Value Ref Range   Free T4 1.1 0.8 - 1.8 ng/dL  TSH  Result Value Ref Range   TSH 3.79 0.40 - 4.50 mIU/L  PSA  Result Value Ref Range   PSA 0.54 < OR = 4.00 ng/mL  Lipid panel  Result Value Ref Range   Cholesterol 175 <200 mg/dL   HDL 46 > OR = 40 mg/dL   Triglycerides 93 <150 mg/dL   LDL Cholesterol (Calc) 110 (H) mg/dL (calc)   Total CHOL/HDL Ratio 3.8 <5.0 (calc)   Non-HDL Cholesterol (Calc) 129 <130 mg/dL (calc)  COMPLETE METABOLIC PANEL WITH GFR  Result Value Ref Range   Glucose, Bld 108 (H) 65 - 99 mg/dL   BUN 13 7 - 25 mg/dL   Creat 0.85 0.70 - 1.35 mg/dL   eGFR 98 > OR = 60 mL/min/1.24m   BUN/Creatinine Ratio NOT APPLICABLE 6 - 22 (calc)   Sodium 140 135 - 146 mmol/L   Potassium 4.4 3.5 - 5.3 mmol/L   Chloride 107 98 - 110 mmol/L   CO2 26 20 - 32 mmol/L   Calcium 9.0 8.6 - 10.3 mg/dL   Total Protein 7.1 6.1 - 8.1 g/dL   Albumin 4.0 3.6 - 5.1 g/dL   Globulin 3.1 1.9 - 3.7 g/dL (calc)   AG Ratio 1.3 1.0 - 2.5 (calc)   Total Bilirubin 0.9 0.2 - 1.2 mg/dL   Alkaline phosphatase (APISO) 69 35 - 144 U/L   AST 23 10 - 35 U/L   ALT 24 9 - 46 U/L  CBC with Differential/Platelet  Result Value Ref Range   WBC 6.0 3.8 - 10.8 Thousand/uL   RBC 4.63 4.20 - 5.80 Million/uL   Hemoglobin 16.0 13.2 - 17.1 g/dL   HCT 45.9 38.5 - 50.0 %   MCV 99.1 80.0 - 100.0 fL   MCH 34.6 (H)  27.0 - 33.0 pg   MCHC 34.9 32.0 - 36.0 g/dL   RDW 12.3 11.0 - 15.0 %   Platelets 265 140 - 400 Thousand/uL   MPV 10.7 7.5 - 12.5 fL   Neutro Abs 2,622 1,500 - 7,800 cells/uL   Lymphs Abs 2,586 850 - 3,900 cells/uL   Absolute Monocytes 594 200 - 950 cells/uL   Eosinophils Absolute 138 15 - 500 cells/uL   Basophils Absolute 60 0 - 200 cells/uL   Neutrophils Relative % 43.7 %   Total Lymphocyte 43.1 %   Monocytes Relative 9.9 %   Eosinophils Relative 2.3 %   Basophils Relative 1.0 %  Hemoglobin A1c  Result Value Ref Range   Hgb A1c MFr Bld 6.2 (H) <5.7 % of total Hgb   Mean Plasma Glucose 131 mg/dL   eAG (mmol/L) 7.3 mmol/L      Assessment & Plan:   Problem List Items Addressed This Visit     Pure hypercholesterolemia    LDL down to 110 improved  The 10-year ASCVD risk score (Arnett DK, et al., 2019) is: 9.8%   Plan: 1. Not on statin. Prior discussed ASCVD risk. Offered Statin therapy - declined today. 2. Encourage improved lifestyle - low carb/cholesterol, reduce portion size, continue improving regular exercise  Pre-diabetes    A1c up to 6.2, prior 5.9 to 6.1 range Concern with obesity (improving wt loss), HLD  Plan:  1. Not on any therapy currently 2. Encourage improved lifestyle - low carb, low sugar diet, reduce portion size, regular exercise      Obesity (BMI 35.0-39.9 without comorbidity)   Other Visit Diagnoses     Annual physical exam    -  Primary       Updated Health Maintenance information Due for Shingrix and TDap in future. Reviewed recent lab results with patient Encouraged improvement to lifestyle with diet and exercise Goal of weight loss   No orders of the defined types were placed in this encounter.     Follow up plan: Return in about 1 year (around 07/05/2022) for 1 year fasting lab only then 1 week later Annual Physical.  Future  CMET CBC Lipid A1c PSA TSH  Nobie Putnam, DO [facility] 07/05/2021, 2:57 PM

## 2021-07-06 ENCOUNTER — Other Ambulatory Visit: Payer: Self-pay | Admitting: Family Medicine

## 2021-07-06 DIAGNOSIS — Z0000 Encounter for general adult medical examination without abnormal findings: Secondary | ICD-10-CM

## 2021-07-06 DIAGNOSIS — Z125 Encounter for screening for malignant neoplasm of prostate: Secondary | ICD-10-CM

## 2021-07-06 DIAGNOSIS — E669 Obesity, unspecified: Secondary | ICD-10-CM

## 2021-07-06 DIAGNOSIS — R7303 Prediabetes: Secondary | ICD-10-CM

## 2021-07-06 DIAGNOSIS — E7800 Pure hypercholesterolemia, unspecified: Secondary | ICD-10-CM

## 2021-07-06 NOTE — Assessment & Plan Note (Signed)
A1c up to 6.2, prior 5.9 to 6.1 range Concern with obesity (improving wt loss), HLD  Plan:  1. Not on any therapy currently 2. Encourage improved lifestyle - low carb, low sugar diet, reduce portion size, regular exercise

## 2021-07-06 NOTE — Assessment & Plan Note (Signed)
LDL down to 110 improved  The 10-year ASCVD risk score (Arnett DK, et al., 2019) is: 9.8%   Plan: 1. Not on statin. Prior discussed ASCVD risk. Offered Statin therapy - declined today. 2. Encourage improved lifestyle - low carb/cholesterol, reduce portion size, continue improving regular exercise

## 2021-07-25 ENCOUNTER — Other Ambulatory Visit: Payer: Self-pay

## 2021-07-25 ENCOUNTER — Ambulatory Visit (INDEPENDENT_AMBULATORY_CARE_PROVIDER_SITE_OTHER): Payer: PRIVATE HEALTH INSURANCE | Admitting: Dermatology

## 2021-07-25 DIAGNOSIS — L578 Other skin changes due to chronic exposure to nonionizing radiation: Secondary | ICD-10-CM

## 2021-07-25 DIAGNOSIS — L739 Follicular disorder, unspecified: Secondary | ICD-10-CM | POA: Diagnosis not present

## 2021-07-25 DIAGNOSIS — L910 Hypertrophic scar: Secondary | ICD-10-CM | POA: Diagnosis not present

## 2021-07-25 DIAGNOSIS — L820 Inflamed seborrheic keratosis: Secondary | ICD-10-CM

## 2021-07-25 DIAGNOSIS — L821 Other seborrheic keratosis: Secondary | ICD-10-CM

## 2021-07-25 DIAGNOSIS — Z85828 Personal history of other malignant neoplasm of skin: Secondary | ICD-10-CM

## 2021-07-25 NOTE — Progress Notes (Signed)
? ?Follow-Up Visit ?  ?Subjective  ?Alexander Atkinson is a 61 y.o. male who presents for the following: Follow-up (Patient here today for follow up on isk and aks. Patient reports a few spots at back and face/scalp he would like checked. Patient reports having halo done at dermatology clinic in [place] and was very please with results. ). ? ?The patient has spots, moles and lesions to be evaluated, some may be new or changing and the patient has concerns that these could be cancer. ? ?The following portions of the chart were reviewed this encounter and updated as appropriate:   ?  ? ?Review of Systems: No other skin or systemic complaints except as noted in HPI or Assessment and Plan. ? ? ?Objective  ?Well appearing patient in no apparent distress; mood and affect are within normal limits. ? ?A focused examination was performed including neck, back, face, scalp. Relevant physical exam findings are noted in the Assessment and Plan. ? ?back ?Pink follicular papules at back ? ?right posterior neck ?Thickened scar at right posterior neck at previous [facility] site ? ?left temple area x 3, right temple x 2, scalp x 4, (9) ?Pink scaly/keratotic waxy papules  ? ? ?Assessment & Plan  ?Folliculitis ?back ? ?Controlled ? ?Continue Clindamycin 1 % gel qd prn flares ? ?Continue Ketoconazole 2 % shampoo qd in shower ? ? ? ?Related Medications ?clindamycin (CLINDAGEL) 1 % gel ?Apply to aa's back and scalp QD PRN ? ?ketoconazole (NIZORAL) 2 % shampoo ?Shampoo into the scalp let sit 5-10 minutes before washing out. Use once to twice weekly. ? 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 ?Bx proven bcc treated in 2015 ? ?Discussed if [place] doesn't help thin scar will consider rebiopsy ? ?Will recheck at 2 month follow up ? ?Intralesional steroid injection side effects were reviewed including thinning of the skin and discoloration, such as redness, lightening or darkening. ? ? ?Intralesional injection - right posterior neck ?Location: right  posterior neck  ? ?Informed Consent: Discussed risks (infection, pain, bleeding, bruising, thinning of the skin, loss of skin pigment, lack of resolution, and recurrence of lesion) and benefits of the procedure, as well as the alternatives. Informed consent was obtained. ?Preparation: The area was prepared a standard fashion. ? ?Procedure Details: An intralesional injection was performed with Kenalog 10 mg/cc. 0.8 cc in total were injected. ? ?Total number of injections: < 7  ? ?Plan: The patient was instructed on post-op care. Recommend OTC analgesia as needed for pain. ? ?[place] # [id number] ?Lot# [id number] ?Exp# 12/2022 ? ?Inflamed seborrheic keratosis (9) ?left temple area x 3, right temple x 2, scalp x 4, ? ?Vs Aks ? ?Discussed cosmetic procedure, noncovered.  $60 for 1st lesion and $15 for each additional lesion if done on the same day.  Maximum charge $350.  One touch-up treatment included no charge. Discussed risks of treatment including dyspigmentation, small scar, and/or recurrence. Recommend daily broad spectrum sunscreen SPF 30+/photoprotection to treated areas once healed. ? ? ? ? ?Destruction of lesion - left temple area x 3, right temple x 2, scalp x 4, ? ?Destruction method: cryotherapy   ?Informed consent: discussed and consent obtained   ?Lesion destroyed using liquid nitrogen: Yes   ?Region frozen until ice ball extended beyond lesion: Yes   ?Outcome: patient tolerated procedure well with no complications   ?Post-procedure details: wound care instructions given   ?Additional details:  Prior to procedure, discussed risks of blister formation, small wound, skin dyspigmentation, or rare scar  following cryotherapy. Recommend Vaseline ointment to treated areas while healing. ? ? ? ?Seborrheic Keratoses ?- Stuck-on, waxy, tan-brown papules and/or plaques  ?- Benign-appearing ?- Discussed benign etiology and prognosis. ?- Observe ?- Call for any changes ? ?Actinic Damage ?- chronic, secondary to cumulative  UV radiation exposure/sun exposure over time ?- diffuse scaly erythematous macules with underlying dyspigmentation ?- Recommend daily broad spectrum sunscreen SPF 30+ to sun-exposed areas, reapply every 2 hours as needed.  ?- Recommend staying in the shade or wearing long sleeves, sun glasses (UVA+UVB protection) and wide brim hats (4-inch brim around the entire circumference of the hat). ?- Call for new or changing lesions. ? ?History of Basal Cell Carcinoma of the Skin ?- No evidence of recurrence today at right posterior neck 2015 ?- Recommend regular full body skin exams ?- Recommend daily broad spectrum sunscreen SPF 30+ to sun-exposed areas, reapply every 2 hours as needed.  ?- Call if any new or changing lesions are noted between office visits ? ?Return if symptoms worsen or fail to improve, for 2 months follow up on hypertrophic scar, 6 month ak and isk follow up. ?I, Ruthell Rummage, CMA, am acting as scribe for Brendolyn Patty, MD. ? ?Documentation: I have reviewed the above documentation for accuracy and completeness, and I agree with the above. ? ?Brendolyn Patty MD  ? ?

## 2021-07-25 NOTE — Patient Instructions (Addendum)
? ? ?Actinic keratoses are precancerous spots that appear secondary to cumulative UV radiation exposure/sun exposure over time. They are chronic with expected duration over 1 year. A portion of actinic keratoses will progress to squamous cell carcinoma of the skin. It is not possible to reliably predict which spots will progress to skin cancer and so treatment is recommended to prevent development of skin cancer. ? ?Recommend daily broad spectrum sunscreen SPF 30+ to sun-exposed areas, reapply every 2 hours as needed.  ?Recommend staying in the shade or wearing long sleeves, sun glasses (UVA+UVB protection) and wide brim hats (4-inch brim around the entire circumference of the hat). ?Call for new or changing lesions.  ? ? ?Cryotherapy Aftercare ? ?Wash gently with soap and water everyday.   ?Apply Vaseline and Band-Aid daily until healed.  ? ? ?Seborrheic Keratosis ? ?What causes seborrheic keratoses? ?Seborrheic keratoses are harmless, common skin growths that first appear during adult life.  As time goes by, more growths appear.  Some people may develop a large number of them.  Seborrheic keratoses appear on both covered and uncovered body parts.  They are not caused by sunlight.  The tendency to develop seborrheic keratoses can be inherited.  They vary in color from skin-colored to gray, brown, or even black.  They can be either smooth or have a rough, warty surface.   ?Seborrheic keratoses are superficial and look as if they were stuck on the skin.  Under the microscope this type of keratosis looks like layers upon layers of skin.  That is why at times the top layer may seem to fall off, but the rest of the growth remains and re-grows.   ? ?Treatment ?Seborrheic keratoses do not need to be treated, but can easily be removed in the office.  Seborrheic keratoses often cause symptoms when they rub on clothing or jewelry.  Lesions can be in the way of shaving.  If they become inflamed, they can cause itching,  soreness, or burning.  Removal of a seborrheic keratosis can be accomplished by freezing, burning, or surgery. ?If any spot bleeds, scabs, or grows rapidly, please return to have it checked, as these can be an indication of a skin cancer. ? ? ? ? ?If You Need Anything After Your Visit ? ?If you have any questions or concerns for your doctor, please call our main line at [phone] and press option 4 to reach your doctor's medical assistant. If no one answers, please leave a voicemail as directed and we will return your call as soon as possible. Messages left after 4 pm will be answered the following business day.  ? ?You may also send [place] a message via MyChart. We typically respond to MyChart messages within 1-2 business days. ? ?For prescription refills, please ask your pharmacy to contact our office. Our fax number is [phone]. ? ?If you have an urgent issue when the clinic is closed that cannot wait until the next business day, you can page your doctor at the number below.   ? ?Please note that while we do our best to be available for urgent issues outside of office hours, we are not available 24/7.  ? ?If you have an urgent issue and are unable to reach [place], you may choose to seek medical care at your doctor's office, retail clinic, urgent care center, or emergency room. ? ?If you have a medical emergency, please immediately call 911 or go to the emergency department. ? ?Pager Numbers ? ?- Dr. Nehemiah Massed:  [phone] ? ?- Dr. Laurence Ferrari: [phone] ? ?- Dr. Nicole Kindred: [phone] ? ?In the event of inclement weather, please call our main line at [phone] for an update on the status of any delays or closures. ? ?Dermatology Medication Tips: ?Please keep the boxes that topical medications come in in order to help keep track of the instructions about where and how to use these. Pharmacies typically print the medication instructions only on the boxes and not directly on the medication tubes.  ? ?If your medication  is too expensive, please contact our office at [phone] option 4 or send [place] a message through [place].  ? ?We are unable to tell what your co-pay for medications will be in advance as this is different depending on your insurance coverage. However, we may be able to find a substitute medication at lower cost or fill out paperwork to get insurance to cover a needed medication.  ? ?If a prior authorization is required to get your medication covered by your insurance company, please allow [place] 1-2 business days to complete this process. ? ?Drug prices often vary depending on where the prescription is filled and some pharmacies may offer cheaper prices. ? ?The website www.goodrx.com contains coupons for medications through different pharmacies. The prices here do not account for what the cost may be with help from insurance (it may be cheaper with your insurance), but the website can give you the price if you did not use any insurance.  ?- You can print the associated coupon and take it with your prescription to the pharmacy.  ?- You may also stop by our office during regular business hours and pick up a GoodRx coupon card.  ?- If you need your prescription sent electronically to a different pharmacy, notify our office through [facility] or by phone at [phone] option 4. ? ? ? ? ?Si Usted Necesita Algo Despu?s de Su Visita ? ?Tambi?n puede enviarnos un mensaje a trav?s de MyChart. Por lo general respondemos a los mensajes de MyChart en el transcurso de 1 a 2 d?as h?biles. ? ?Para renovar recetas, por favor pida a su farmacia que se ponga en contacto con nuestra oficina. Nuestro n?mero de fax es el [phone]. ? ?Si tiene un asunto urgente cuando la cl?nica est? cerrada y que no puede esperar hasta el siguiente d?a h?bil, puede llamar/localizar a su doctor(a) al n?mero que aparece a continuaci?n.  ? ?Por favor, tenga en cuenta que aunque hacemos todo lo posible para estar disponibles para asuntos  urgentes fuera del horario de oficina, no estamos disponibles las 24 horas del d?a, los 7 d?as de la semana.  ? ?Si tiene un problema urgente y no puede comunicarse con nosotros, puede optar por buscar atenci?n m?dica  en el consultorio de su doctor(a), en una cl?nica privada, en un centro de atenci?n urgente o en una sala de emergencias. ? ?Si tiene Engineer, maintenance (IT) m?dica, por favor llame inmediatamente al 911 o vaya a la sala de emergencias. ? ?N?meros de b?per ? ?- Dr. Nehemiah Massed: [phone] ? ?- Dra. Moye: [phone] ? ?- Dra. Nicole Kindred: [phone] ? ?En caso de inclemencias del tiempo, por favor llame a nuestra l?nea principal al [phone] para una actualizaci?n sobre el estado de cualquier retraso o cierre. ? ?Consejos para la medicaci?n en dermatolog?a: ?Por favor, guarde las cajas en las que vienen los medicamentos de uso t?pico para ayudarle a seguir las instrucciones sobre d?nde y c?mo usarlos. Las farmacias generalmente imprimen las instrucciones del medicamento s?lo en  las cajas y no directamente en los tubos del [place].  ? ?Si su medicamento es muy caro, por favor, p?ngase en contacto con Zigmund Daniel llamando al [phone] y presione la opci?n 4 o env?enos un mensaje a trav?s de MyChart.  ? ?No podemos decirle cu?l ser? su copago por los medicamentos por adelantado ya que esto es diferente dependiendo de la cobertura de su seguro. Sin embargo, es posible que podamos encontrar un medicamento sustituto a Electrical engineer un formulario para que el seguro cubra el medicamento que se considera necesario.  ? ?Si se requiere Ardelia Mems autorizaci?n previa para que su compa??a de seguros [place] su medicamento, por favor perm?tanos de 1 a 2 d?as h?biles para completar este proceso. ? ?Los precios de los medicamentos var?an con frecuencia dependiendo del Environmental consultant de d?nde se surte la receta y alguna farmacias pueden ofrecer precios m?s baratos. ? ?El sitio web www.goodrx.com tiene cupones para  medicamentos de Airline pilot. Los precios aqu? no tienen en cuenta lo que podr?a costar con la ayuda del seguro (puede ser m?s barato con su seguro), pero el sitio web puede darle el precio si no utiliz? ning?n seg

## 2021-09-25 ENCOUNTER — Ambulatory Visit (INDEPENDENT_AMBULATORY_CARE_PROVIDER_SITE_OTHER): Payer: PRIVATE HEALTH INSURANCE | Admitting: Dermatology

## 2021-09-25 DIAGNOSIS — C4441 Basal cell carcinoma of skin of scalp and neck: Secondary | ICD-10-CM | POA: Diagnosis not present

## 2021-09-25 DIAGNOSIS — D492 Neoplasm of unspecified behavior of bone, soft tissue, and skin: Secondary | ICD-10-CM

## 2021-09-25 NOTE — Patient Instructions (Addendum)

## 2021-09-25 NOTE — Progress Notes (Signed)
? ?  Follow-Up Visit ?  ?Subjective  ?Alexander Atkinson is a 61 y.o. male who presents for the following: Follow-up (2 months f/u hypertropic scar hx of BCC at the right posterior neck, BCC removed in 2015). ? ? ? ?The following portions of the chart were reviewed this encounter and updated as appropriate:  ?  ?  ? ?Review of Systems:  No other skin or systemic complaints except as noted in HPI or Assessment and Plan. ? ?Objective  ?Well appearing patient in no apparent distress; mood and affect are within normal limits. ? ?A focused examination was performed including face,neck. Relevant physical exam findings are noted in the Assessment and Plan. ? ?right posterior  neck ?1.0 cm firm pink flesh nodule  ? ? ? ? ? ? ? ? ?Assessment & Plan  ?Neoplasm of skin ?right posterior  neck ? ?Skin / nail biopsy ?Type of biopsy: tangential   ?Informed consent: discussed and consent obtained   ?Patient was prepped and draped in usual sterile fashion: area prepped with alochol. ?Anesthesia: the lesion was anesthetized in a standard fashion   ?Anesthetic:  1% lidocaine w/ epinephrine 1-100,000 buffered w/ 8.4% NaHCO3 ?Instrument used: flexible razor blade   ?Hemostasis achieved with: pressure, aluminum chloride and electrodesiccation   ?Outcome: patient tolerated procedure well   ?Post-procedure details: wound care instructions given   ?Post-procedure details comment:  Ointment and small bandage ? ?Specimen 1 - Surgical pathology ?Differential Diagnosis: R/O hypertrophic scar vs Recurrent BCC ? ?Check Margins: No ? ?R/O hypertrophic scar vs Recurrent BCC  ? ?No improvement with IL steroid injection ? ?If pathology results confirm recurrent skin cancer, discussed excision vrs Mohs  ? ? ?Return in about 4 months (around 01/26/2022) for TBSE, hx of BCC. ? ?I, Marye Round, CMA, am acting as scribe for Brendolyn Patty, MD .  ? ?Documentation: I have reviewed the above documentation for accuracy and completeness, and I agree with the  above. ? ?Brendolyn Patty MD  ?

## 2021-09-27 ENCOUNTER — Telehealth: Payer: Self-pay

## 2021-09-27 NOTE — Telephone Encounter (Signed)
-----   Message from Brendolyn Patty, MD sent at 09/26/2021  6:21 PM EDT ----- ?Skin , right posterior neck ?BASAL CELL CARCINOMA, NODULAR PATTERN, BASE INVOLVED ? ?BCC skin cancer has recurred, needs further treatment- excision here in office vrs Mohs surgery [facility]) ? ? - please call patient ?

## 2021-09-27 NOTE — Telephone Encounter (Signed)
Advised pt of bx results.  Discussed treatment options in office excision vs Mohs.  Pt would like to have surgery at [facility].  Pt scheduled for excision 10/02/21 at 2:30./sh ?

## 2021-10-02 ENCOUNTER — Ambulatory Visit (INDEPENDENT_AMBULATORY_CARE_PROVIDER_SITE_OTHER): Payer: PRIVATE HEALTH INSURANCE | Admitting: Dermatology

## 2021-10-02 DIAGNOSIS — C4441 Basal cell carcinoma of skin of scalp and neck: Secondary | ICD-10-CM | POA: Diagnosis not present

## 2021-10-02 MED ORDER — DOXYCYCLINE MONOHYDRATE 100 MG PO CAPS: 100.0000 mg | ORAL_CAPSULE | Freq: Two times a day (BID) | ORAL | 0 refills | Status: AC

## 2021-10-02 MED ORDER — MUPIROCIN 2 % EX OINT: 1.0000 "application " | TOPICAL_OINTMENT | Freq: Every day | CUTANEOUS | 0 refills | Status: AC

## 2021-10-02 NOTE — Patient Instructions (Signed)
Wound Care Instructions  On the day following your surgery, you should begin doing daily dressing changes: Remove the old dressing and discard it. Cleanse the wound gently with tap water. This may be done in the shower or by placing a wet gauze pad directly on the wound and letting it soak for several minutes. It is important to gently remove any dried blood from the wound in order to encourage healing. This may be done by gently rolling a moistened Q-tip on the dried blood. Do not pick at the wound. If the wound should start to bleed, continue cleaning the wound, then place a moist gauze pad on the wound and hold pressure for a few minutes.  Make sure you then dry the skin surrounding the wound completely or the tape will not stick to the skin. Do not use cotton balls on the wound. After the wound is clean and dry, apply the ointment gently with a Q-tip. Cut a non-stick pad to fit the size of the wound. Lay the pad flush to the wound. If the wound is draining, you may want to reinforce it with a small amount of gauze on top of the non-stick pad for a little added compression to the area. Use the tape to seal the area completely. Select from the following with respect to your individual situation: If your wound has been stitched closed: continue the above steps 1-8 at least daily until your sutures are removed. If your wound has been left open to heal: continue steps 1-8 at least daily for the first 3-4 weeks. We would like for you to take a few extra precautions for at least the next week. Sleep with your head elevated on pillows if our wound is on your head. Do not bend over or lift heavy items to reduce the chance of elevated blood pressure to the wound Do not participate in particularly strenuous activities.   Below is a list of dressing supplies you might need.  Cotton-tipped applicators - Q-tips Gauze pads (2x2 and/or 4x4) - All-Purpose Sponges Non-stick dressing material - Telfa Tape -  Paper or Hypafix New and clean tube of petroleum jelly - Vaseline    Comments on Post-Operative Period Slight swelling and redness often appear around the wound. This is normal and will disappear within several days following the surgery. The healing wound will drain a brownish-red-yellow discharge during healing. This is a normal phase of wound healing. As the wound begins to heal, the drainage may increase in amount. Again, this drainage is normal. Notify us if the drainage becomes persistently bloody, excessively swollen, or intensely painful or develops a foul odor or red streaks.  If you should experience mild discomfort during the healing phase, you may take an aspirin-free medication such as Tylenol (acetaminophen). Notify us if the discomfort is severe or persistent. Avoid alcoholic beverages when taking pain medicine.  In Case of Wound Hemorrhage A wound hemorrhage is when the bandage suddenly becomes soaked with bright red blood and flows profusely. If this happens, sit down or lie down with your head elevated. If the wound has a dressing on it, do not remove the dressing. Apply pressure to the existing gauze. If the wound is not covered, use a gauze pad to apply pressure and continue applying the pressure for 20 minutes without peeking. DO NOT COVER THE WOUND WITH A LARGE TOWEL OR WASH CLOTH. Release your hand from the wound site but do not remove the dressing. If the bleeding has stopped,   gently clean around the wound. Leave the dressing in place for 24 hours if possible. This wait time allows the blood vessels to close off so that you do not spark a new round of bleeding by disrupting the newly clotted blood vessels with an immediate dressing change. If the bleeding does not subside, continue to hold pressure. If matters are out of your control, contact an After Hours clinic or go to the Emergency Room.  If You Need Anything After Your Visit  If you have any questions or concerns for your  doctor, please call our main line at [phone] and press option 4 to reach your doctor's medical assistant. If no one answers, please leave a voicemail as directed and we will return your call as soon as possible. Messages left after 4 pm will be answered the following business day.   You may also send [place] a message via [place]. We typically respond to MyChart messages within 1-2 business days.  For prescription refills, please ask your pharmacy to contact our office. Our fax number is [phone].  If you have an urgent issue when the clinic is closed that cannot wait until the next business day, you can page your doctor at the number below.    Please note that while we do our best to be available for urgent issues outside of office hours, we are not available 24/7.   If you have an urgent issue and are unable to reach [place], you may choose to seek medical care at your doctor's office, retail clinic, urgent care center, or emergency room.  If you have a medical emergency, please immediately call 911 or go to the emergency department.  Pager Numbers  - Dr. Nehemiah Massed: [phone]  - Dr. Laurence Ferrari: [phone]  - Dr. Nicole Kindred: [phone]  In the event of inclement weather, please call our main line at [phone] for an update on the status of any delays or closures.  Dermatology Medication Tips: Please keep the boxes that topical medications come in in order to help keep track of the instructions about where and how to use these. Pharmacies typically print the medication instructions only on the boxes and not directly on the medication tubes.   If your medication is too expensive, please contact our office at [phone] option 4 or send [place] a message through [place].   We are unable to tell what your co-pay for medications will be in advance as this is different depending on your insurance coverage. However, we may be able to find a substitute medication at lower cost or fill out paperwork  to get insurance to cover a needed medication.   If a prior authorization is required to get your medication covered by your insurance company, please allow [place] 1-2 business days to complete this process.  Drug prices often vary depending on where the prescription is filled and some pharmacies may offer cheaper prices.  The website www.goodrx.com contains coupons for medications through different pharmacies. The prices here do not account for what the cost may be with help from insurance (it may be cheaper with your insurance), but the website can give you the price if you did not use any insurance.  - You can print the associated coupon and take it with your prescription to the pharmacy.  - You may also stop by our office during regular business hours and pick up a GoodRx coupon card.  - If you need your prescription sent electronically to a different pharmacy, notify our office through  [place] or by phone at [phone] option 4.     Si Usted Necesita Algo Despus de Su Visita  Tambin puede enviarnos un mensaje a travs de Pharmacist, community. Por lo general respondemos a los mensajes de MyChart en el transcurso de 1 a 2 das hbiles.  Para renovar recetas, por favor pida a su farmacia que se ponga en contacto con nuestra oficina. Harland Dingwall de fax es [place] [phone].  Si tiene un asunto urgente cuando la clnica est cerrada y que no puede esperar hasta el siguiente da hbil, puede llamar/localizar a su doctor(a) al nmero que aparece a continuacin.   Por favor, tenga en cuenta que aunque hacemos todo lo posible para estar disponibles para asuntos urgentes fuera del horario de [place], no estamos disponibles las 24 horas del da, los 7 das de la [place].   Si tiene un problema urgente y no puede comunicarse con nosotros, puede optar por buscar atencin mdica  en el consultorio de su doctor(a), en una clnica privada, en un centro de atencin urgente o en una sala de  emergencias.  Si tiene Engineering geologist, por favor llame inmediatamente al 911 o vaya a la sala de emergencias.  Nmeros de bper  - Dr. Nehemiah Massed: [phone]  - Dra. Moye: [phone]  - Dra. Nicole Kindred: [phone]  En caso de inclemencias del [place], por favor llame a Johnsie Kindred principal al [phone] para una actualizacin sobre el [place] de cualquier retraso o cierre.  Consejos para la medicacin en dermatologa: Por favor, guarde las cajas en las que vienen los medicamentos de uso tpico para ayudarle a seguir las instrucciones sobre dnde y cmo usarlos. Las farmacias generalmente imprimen las instrucciones del medicamento slo en las cajas y no directamente en los tubos del [place].   Si su medicamento es muy caro, por favor, pngase en contacto con Zigmund Daniel llamando al [phone] y presione la opcin 4 o envenos un mensaje a travs de Pharmacist, community.   No podemos decirle cul ser su copago por los medicamentos por adelantado ya que esto es diferente dependiendo de la cobertura de su seguro. Sin embargo, es posible que podamos encontrar un medicamento sustituto a Electrical engineer un formulario para que el seguro cubra el medicamento que se considera necesario.   Si se requiere una autorizacin previa para que su compaa de seguros [place] su medicamento, por favor permtanos de 1 a 2 das hbiles para completar este proceso.  Los precios de los medicamentos varan con frecuencia dependiendo del Environmental consultant de dnde se surte la receta y alguna farmacias pueden ofrecer precios ms baratos.  El sitio web www.goodrx.com tiene cupones para medicamentos de Airline pilot. Los precios aqu no tienen en cuenta lo que podra costar con la ayuda del seguro (puede ser ms barato con su seguro), pero el sitio web puede darle el precio si no utiliz Research scientist (physical sciences).  - Puede imprimir el cupn correspondiente y llevarlo con su receta a la farmacia.  - Tambin puede pasar por  nuestra oficina durante el horario de atencin regular y Charity fundraiser una tarjeta de cupones de GoodRx.  - Si necesita que su receta se enve electrnicamente a una farmacia diferente, informe a nuestra oficina a travs de MyChart de [place] o por telfono llamando al [phone] y presione la opcin 4.

## 2021-10-02 NOTE — Progress Notes (Signed)
   Follow-Up Visit   Subjective  Alexander Atkinson is a 61 y.o. male who presents for the following: BCC bx proven (R post neck, pt presents for excision).   The following portions of the chart were reviewed this encounter and updated as appropriate:       Review of Systems:  No other skin or systemic complaints except as noted in HPI or Assessment and Plan.  Objective  Well appearing patient in no apparent distress; mood and affect are within normal limits.  A focused examination was performed including neck. Relevant physical exam findings are noted in the Assessment and Plan.  R post neck Pink bx site 1.5 x 1.9cm    Assessment & Plan  Basal cell carcinoma (BCC) of skin of neck R post neck  Skin excision  Lesion length (cm):  1.5 Lesion width (cm):  1.9 Margin per side (cm):  0.2 Total excision diameter (cm):  2.3 Informed consent: discussed and consent obtained   Timeout: patient name, date of birth, surgical site, and procedure verified   Procedure prep:  Patient was prepped and draped in usual sterile fashion Prep type:  Povidone-iodine Anesthesia: the lesion was anesthetized in a standard fashion   Anesthetic:  1% lidocaine w/ epinephrine 1-100,000 buffered w/ 8.4% NaHCO3 (6cc lido w/ epi, 6cc bupivicaine, Total of 12cc) Instrument used: #15 blade   Hemostasis achieved with: suture, pressure and electrodesiccation   Outcome: patient tolerated procedure well with no complications   Additional details:  Tagged at superior 12 o'clock  Skin repair Complexity:  Intermediate Final length (cm):  5 Informed consent: discussed and consent obtained   Timeout: patient name, date of birth, surgical site, and procedure verified   Reason for type of repair: reduce tension to allow closure, reduce the risk of dehiscence, infection, and necrosis, reduce subcutaneous dead space and avoid a hematoma, preserve normal anatomical and functional relationships and enhance both  functionality and cosmetic results   Undermining: edges undermined   Subcutaneous layers (deep stitches):  Suture size:  4-0 and 3-0 Suture type: Vicryl (polyglactin 910)   Subcutaneous suture technique: inverted dermal. Fine/surface layer approximation (top stitches):  Suture size:  4-0 Suture type: nylon   Stitches: simple interrupted   Suture removal (days):  7 Hemostasis achieved with: suture Outcome: patient tolerated procedure well with no complications   Post-procedure details: sterile dressing applied and wound care instructions given   Dressing type: pressure dressing (Mupirocin ointment)    mupirocin ointment (BACTROBAN) 2 % Apply 1 application. topically daily.  doxycycline (MONODOX) 100 MG capsule Take 1 capsule (100 mg total) by mouth 2 (two) times daily for 7 days. Take with food  Specimen 1 - Surgical pathology Differential Diagnosis: BCC bx proven  Check Margins: yes Pink bx site 1.5 x 1.9cm [id number] Tagged at superior 12 o'clock  Bx proven, excised today  Start doxycycline monohydrate 100 mg 1 po bid with food. Doxycycline should be taken with food to prevent nausea. Do not lay down for 30 minutes after taking. Be cautious with sun exposure and use good sun protection while on this medication. Pregnant women should not take this medication.   Start Mupirocin oint qd to excision site and cover   Return in about 8 days (around 10/10/2021) for suture removal.  I, Sonya Hupman, RMA, am acting as scribe for Brendolyn Patty, MD .  Documentation: I have reviewed the above documentation for accuracy and completeness, and I agree with the above.  Brendolyn Patty MD

## 2021-10-03 ENCOUNTER — Telehealth: Payer: Self-pay

## 2021-10-03 NOTE — Telephone Encounter (Signed)
Pt doing fine after yesterdays surgery./sh 

## 2021-10-04 ENCOUNTER — Telehealth: Payer: Self-pay

## 2021-10-04 NOTE — Telephone Encounter (Signed)
Patient called to advise he is still having some numbness behind ear following Monday's surgery. Dr. Nicole Kindred advised me to tell him that it is normal due to nerves being injured with the excision but should resolve over the next few months.  Lurlean Horns., RMA

## 2021-10-10 ENCOUNTER — Ambulatory Visit (INDEPENDENT_AMBULATORY_CARE_PROVIDER_SITE_OTHER): Payer: PRIVATE HEALTH INSURANCE | Admitting: Dermatology

## 2021-10-10 DIAGNOSIS — C4441 Basal cell carcinoma of skin of scalp and neck: Secondary | ICD-10-CM

## 2021-10-10 DIAGNOSIS — Z4802 Encounter for removal of sutures: Secondary | ICD-10-CM

## 2021-10-10 NOTE — Progress Notes (Signed)
   Follow-Up Visit   Subjective  Alexander Atkinson is a 61 y.o. male who presents for the following: Follow-up (Patient here today for suture removal for bx proven BCC at right posterior neck. ).Path BCC, deep margin positive.   The following portions of the chart were reviewed this encounter and updated as appropriate:       Review of Systems:  No other skin or systemic complaints except as noted in HPI or Assessment and Plan.  Objective  Well appearing patient in no apparent distress; mood and affect are within normal limits.  A focused examination was performed including neck. Relevant physical exam findings are noted in the Assessment and Plan.    Assessment & Plan   Encounter for Removal of Sutures - Incision site at the right posterior neck is clean, dry and intact - Wound cleansed, sutures removed, wound cleansed and steri strips applied.  - Discussed pathology results showing deep margin involved, patient advised will monitor for recurrence - If recurs, will refer for Mohs  - Patient advised to keep steri-strips dry until they fall off. - Scars remodel for a full year. - Once steri-strips fall off, patient can apply over-the-counter silicone scar cream each night to help with scar remodeling if desired. - Patient advised to call with any concerns or if they notice any new or changing lesions.  Return for TBSE, as scheduled.  Graciella Belton, RMA, am acting as scribe for Brendolyn Patty, MD .  Documentation: I have reviewed the above documentation for accuracy and completeness, and I agree with the above.  Brendolyn Patty MD

## 2021-10-10 NOTE — Patient Instructions (Signed)

## 2022-02-05 ENCOUNTER — Ambulatory Visit (INDEPENDENT_AMBULATORY_CARE_PROVIDER_SITE_OTHER): Payer: PRIVATE HEALTH INSURANCE | Admitting: Dermatology

## 2022-02-05 DIAGNOSIS — L578 Other skin changes due to chronic exposure to nonionizing radiation: Secondary | ICD-10-CM

## 2022-02-05 DIAGNOSIS — Z1283 Encounter for screening for malignant neoplasm of skin: Secondary | ICD-10-CM

## 2022-02-05 DIAGNOSIS — Z85828 Personal history of other malignant neoplasm of skin: Secondary | ICD-10-CM

## 2022-02-05 DIAGNOSIS — L821 Other seborrheic keratosis: Secondary | ICD-10-CM

## 2022-02-05 DIAGNOSIS — L739 Follicular disorder, unspecified: Secondary | ICD-10-CM

## 2022-02-05 DIAGNOSIS — L570 Actinic keratosis: Secondary | ICD-10-CM

## 2022-02-05 DIAGNOSIS — L814 Other melanin hyperpigmentation: Secondary | ICD-10-CM

## 2022-02-05 MED ORDER — FLUOROURACIL 5 % EX CREA: TOPICAL_CREAM | Freq: Two times a day (BID) | CUTANEOUS | 1 refills | Status: DC

## 2022-02-05 MED ORDER — CLINDAMYCIN PHOSPHATE 1 % EX GEL: CUTANEOUS | 6 refills | Status: AC

## 2022-02-05 NOTE — Patient Instructions (Addendum)
-- On February 11 2022, Start 5-fluorouracil/calcipotriene cream twice a day for 10 days to affected areas including scalp. Prescription sent to [facility].   Instructions for [facility]ations  One or more of your medications was sent to the [facility] mail order compound[facility] pharmacy. You will receive an email from them and can purchase the medicine through that link. It will then be mailed to your home at the address you confirmed. If for any reason you do not receive an email from them, please check your spam folder. If you still do not find the email, please let us know. [facility] phone number is [phone].   5-Fluorouracil/Calcipotriene Patient Education   Actinic keratoses are the dry, red scaly spots on the skin caused by sun damage. A portion of these spots can turn into skin cancer with time, and treating them can help prevent development of skin cancer.   Treatment of these spots requires removal of the defective skin cells. There are various ways to remove actinic keratoses, including freezing with liquid nitrogen, treatment with creams, or treatment with a blue light procedure in the office.   5-fluorouracil cream is a topical cream used to treat actinic keratoses. It works by interfering with the growth of abnormal fast-growing skin cells, such as actinic keratoses. These cells peel off and are replaced by healthy ones.   5-fluorouracil/calcipotriene is a combination of the 5-fluorouracil cream with a vitamin D analog cream called calcipotriene. The calcipotriene alone does not treat actinic keratoses. However, when it is combined with 5-fluorouracil, it helps the 5-fluorouracil treat the actinic keratoses much faster so that the same results can be achieved with a much shorter treatment time.  INSTRUCTIONS FOR 5-FLUOROURACIL/CALCIPOTRIENE CREAM:   5-fluorouracil/calcipotriene cream typically only needs to be used for 4-7 days. A thin  layer should be applied twice a day to the treatment areas recommended by your physician.   If your physician prescribed you separate tubes of 5-fluourouracil and calcipotriene, apply a thin layer of 5-fluorouracil followed by a thin layer of calcipotriene.   Avoid contact with your eyes, nostrils, and mouth. Do not use 5-fluorouracil/calcipotriene cream on infected or open wounds.   You will develop redness, irritation and some crusting at areas where you have pre-cancer damage/actinic keratoses. IF YOU DEVELOP PAIN, BLEEDING, OR SIGNIFICANT CRUSTING, STOP THE TREATMENT EARLY - you have already gotten a good response and the actinic keratoses should clear up well.  Wash your hands after applying 5-fluorouracil 5% cream on your skin.   A moisturizer or sunscreen with a minimum SPF 30 should be applied each morning.   Once you have finished the treatment, you can apply a thin layer of Vaseline twice a day to irritated areas to soothe and calm the areas more quickly. If you experience significant discomfort, contact your physician.  For some patients it is necessary to repeat the treatment for best results.  SIDE EFFECTS: When using 5-fluorouracil/calcipotriene cream, you may have mild irritation, such as redness, dryness, swelling, or a mild burning sensation. This usually resolves within 2 weeks. The more actinic keratoses you have, the more redness and inflammation you can expect during treatment. Eye irritation has been reported rarely. If this occurs, please let us know.  If you have any trouble using this cream, please call the office. If you have any other questions about this information, please do not hesitate to ask me before you leave the office.  Cryotherapy Aftercare  Wash gently with soap and  water everyday.   Apply Vaseline and Band-Aid daily until healed.     Seborrheic Keratosis  What causes seborrheic keratoses? Seborrheic keratoses are harmless, common skin growths that  first appear during adult life.  As time goes by, more growths appear.  Some people may develop a large number of them.  Seborrheic keratoses appear on both covered and uncovered body parts.  They are not caused by sunlight.  The tendency to develop seborrheic keratoses can be inherited.  They vary in color from skin-colored to gray, brown, or even black.  They can be either smooth or have a rough, warty surface.   Seborrheic keratoses are superficial and look as if they were stuck on the skin.  Under the microscope this type of keratosis looks like layers upon layers of skin.  That is why at times the top layer may seem to fall off, but the rest of the growth remains and re-grows.    Treatment Seborrheic keratoses do not need to be treated, but can easily be removed in the office.  Seborrheic keratoses often cause symptoms when they rub on clothing or jewelry.  Lesions can be in the way of shaving.  If they become inflamed, they can cause itching, soreness, or burning.  Removal of a seborrheic keratosis can be accomplished by freezing, burning, or surgery. If any spot bleeds, scabs, or grows rapidly, please return to have it checked, as these can be an indication of a skin cancer.    Due to recent changes in healthcare laws, you may see results of your pathology and/or laboratory studies on MyChart before the doctors have had a chance to review them. We understand that in some cases there may be results that are confusing or concerning to you. Please understand that not all results are received at the same time and often the doctors may need to interpret multiple results in order to provide you with the best plan of care or course of treatment. Therefore, we ask that you please give [place] 2 business days to thoroughly review all your results before contacting the office for clarification. Should we see a critical lab result, you will be contacted sooner.   If You Need Anything After Your Visit  If you  have any questions or concerns for your doctor, please call our main line at [phone] and press option 4 to reach your doctor's medical assistant. If no one answers, please leave a voicemail as directed and we will return your call as soon as possible. Messages left after 4 pm will be answered the following business day.   You may also send [place] a message via [place]. We typically respond to MyChart messages within 1-2 business days.  For prescription refills, please ask your pharmacy to contact our office. Our fax number is [phone].  If you have an urgent issue when the clinic is closed that cannot wait until the next business day, you can page your doctor at the number below.    Please note that while we do our best to be available for urgent issues outside of office hours, we are not available 24/7.   If you have an urgent issue and are unable to reach [place], you may choose to seek medical care at your doctor's office, retail clinic, urgent care center, or emergency room.  If you have a medical emergency, please immediately call 911 or go to the emergency department.  Pager Numbers  - Dr. Nehemiah Massed: [phone]  - Dr. Laurence Ferrari: [phone]  -  Dr. Nicole Kindred: [phone]  In the event of inclement weather, please call our main line at [phone] for an update on the status of any delays or closures.  Dermatology Medication Tips: Please keep the boxes that topical medications come in in order to help keep track of the instructions about where and how to use these. Pharmacies typically print the medication instructions only on the boxes and not directly on the medication tubes.   If your medication is too expensive, please contact our office at [phone] option 4 or send [place] a message through [place].   We are unable to tell what your co-pay for medications will be in advance as this is different depending on your insurance coverage. However, we may be able to find a substitute  medication at lower cost or fill out paperwork to get insurance to cover a needed medication.   If a prior authorization is required to get your medication covered by your insurance company, please allow [place] 1-2 business days to complete this process.  Drug prices often vary depending on where the prescription is filled and some pharmacies may offer cheaper prices.  The website www.goodrx.com contains coupons for medications through different pharmacies. The prices here do not account for what the cost may be with help from insurance (it may be cheaper with your insurance), but the website can give you the price if you did not use any insurance.  - You can print the associated coupon and take it with your prescription to the pharmacy.  - You may also stop by our office during regular business hours and pick up a GoodRx coupon card.  - If you need your prescription sent electronically to a different pharmacy, notify our office through [facility] or by phone at [phone] option 4.     Si Usted Necesita Algo Despus de Su Visita  Tambin puede enviarnos un mensaje a travs de Pharmacist, community. Por lo general respondemos a los mensajes de MyChart en el transcurso de 1 a 2 das hbiles.  Para renovar recetas, por favor pida a su farmacia que se ponga en contacto con nuestra oficina. Harland Dingwall de fax es [place] [phone].  Si tiene un asunto urgente cuando la clnica est cerrada y que no puede esperar hasta el siguiente da hbil, puede llamar/localizar a su doctor(a) al nmero que aparece a continuacin.   Por favor, tenga en cuenta que aunque hacemos todo lo posible para estar disponibles para asuntos urgentes fuera del horario de [place], no estamos disponibles las 24 horas del da, los 7 das de la [place].   Si tiene un problema urgente y no puede comunicarse con nosotros, puede optar por buscar atencin mdica  en el consultorio de su doctor(a), en una clnica privada, en un centro de  atencin urgente o en una sala de emergencias.  Si tiene Engineering geologist, por favor llame inmediatamente al 911 o vaya a la sala de emergencias.  Nmeros de bper  - Dr. Nehemiah Massed: [phone]  - Dra. Moye: [phone]  - Dra. Nicole Kindred: [phone]  En caso de inclemencias del [place], por favor llame a Johnsie Kindred principal al [phone] para una actualizacin sobre el [place] de cualquier retraso o cierre.  Consejos para la medicacin en dermatologa: Por favor, guarde las cajas en las que vienen los medicamentos de uso tpico para ayudarle a seguir las instrucciones sobre dnde y cmo usarlos. Las farmacias generalmente imprimen las instrucciones del medicamento slo en las cajas y no directamente en los tubos  del medicamento.   Si su medicamento es muy caro, por favor, pngase en contacto con Zigmund Daniel llamando al [phone] y presione la opcin 4 o envenos un mensaje a travs de Pharmacist, community.   No podemos decirle cul ser su copago por los medicamentos por adelantado ya que esto es diferente dependiendo de la cobertura de su seguro. Sin embargo, es posible que podamos encontrar un medicamento sustituto a Electrical engineer un formulario para que el seguro cubra el medicamento que se considera necesario.   Si se requiere una autorizacin previa para que su compaa de seguros [place] su medicamento, por favor permtanos de 1 a 2 das hbiles para completar este proceso.  Los precios de los medicamentos varan con frecuencia dependiendo del Environmental consultant de dnde se surte la receta y alguna farmacias pueden ofrecer precios ms baratos.  El sitio web www.goodrx.com tiene cupones para medicamentos de Airline pilot. Los precios aqu no tienen en cuenta lo que podra costar con la ayuda del seguro (puede ser ms barato con su seguro), pero el sitio web puede darle el precio si no utiliz Research scientist (physical sciences).  - Puede imprimir el cupn correspondiente y llevarlo con su receta a la  farmacia.  - Tambin puede pasar por nuestra oficina durante el horario de atencin regular y Charity fundraiser una tarjeta de cupones de GoodRx.  - Si necesita que su receta se enve electrnicamente a una farmacia diferente, informe a nuestra oficina a travs de MyChart de [place] o por telfono llamando al [phone] y presione la opcin 4.

## 2022-02-05 NOTE — Progress Notes (Signed)
Follow-Up Visit   Subjective  Alexander Atkinson is a 61 y.o. male who presents for the following: Upper body skin exam (Hx of BCCs R post neck,  AKs) and check spots (R axilla x 2, no symptoms).  The patient presents for Upper Body Skin Exam (UBSE) for skin cancer screening and mole check.  The patient has spots, moles and lesions to be evaluated, some may be new or changing and the patient has concerns that these could be cancer.   The following portions of the chart were reviewed this encounter and updated as appropriate:       Review of Systems:  No other skin or systemic complaints except as noted in HPI or Assessment and Plan.  Objective  Well appearing patient in no apparent distress; mood and affect are within normal limits.  All skin waist up examined.  R post neck Firm hypertrophic scar  L nasal tip x 1, R nasal dorsum x 1, scalp x 15, L eyebrow x 1 (18) Pink scaly macules  chest, scalp, back Follicular papules chest    Assessment & Plan   Lentigines - Scattered tan macules - Due to sun exposure - Benign-appearing, observe - Recommend daily broad spectrum sunscreen SPF 30+ to sun-exposed areas, reapply every 2 hours as needed. - Call for any changes - back  Seborrheic Keratoses - Stuck-on, waxy, tan-brown papules and/or plaques  - Benign-appearing - Discussed benign etiology and prognosis. - Observe - Call for any changes - face, back, arms   Actinic Damage - Severe, confluent actinic changes with pre-cancerous actinic keratoses  - Severe, chronic, not at goal, secondary to cumulative UV radiation exposure over time - diffuse scaly erythematous macules and papules with underlying dyspigmentation - Discussed Prescription "Field Treatment" for Severe, Chronic Confluent Actinic Changes with Pre-Cancerous Actinic Keratoses Field treatment involves treatment of an entire area of skin that has confluent Actinic Changes (Sun/ Ultraviolet light damage) and  PreCancerous Actinic Keratoses by method of PhotoDynamic Therapy (PDT) and/or prescription Topical Chemotherapy agents such as 5-fluorouracil, 5-fluorouracil/calcipotriene, and/or imiquimod.  The purpose is to decrease the number of clinically evident and subclinical PreCancerous lesions to prevent progression to development of skin cancer by chemically destroying early precancer changes that may or may not be visible.  It has been shown to reduce the risk of developing skin cancer in the treated area. As a result of treatment, redness, scaling, crusting, and open sores may occur during treatment course. One or more than one of these methods may be used and may have to be used several times to control, suppress and eliminate the PreCancerous changes. Discussed treatment course, expected reaction, and possible side effects. - Recommend daily broad spectrum sunscreen SPF 30+ to sun-exposed areas, reapply every 2 hours as needed.  - Staying in the shade or wearing long sleeves, sun glasses (UVA+UVB protection) and wide brim hats (4-inch brim around the entire circumference of the hat) are also recommended. - Call for new or changing lesions.  -- On February 11, 2022 Start 5-fluorouracil/calcipotriene cream twice a day for 10 days to affected areas including scalp. Prescription sent to [facility]. Patient advised they will receive an email to purchase the medication online and have it sent to their home. Patient provided with handout reviewing treatment course and side effects and advised to call or message [place] on MyChart with any concerns.   Skin cancer screening performed today.   History of basal cell carcinoma (BCC) R post neck  Clear.  Observe for recurrence. Call clinic for new or changing lesions.  Recommend regular skin exams, daily broad-spectrum spf 30+ sunscreen use, and photoprotection.    Recommend Serica scar gel  Prior excision had positive deep margin- will continue  close observation  AK (actinic keratosis) (18) L nasal tip x 1, R nasal dorsum x 1, scalp x 15, L eyebrow x 1  Will plan 5FU/Calcipotriene to scalp and temples bid for 7-10 days  5-fluorouracil/calcipotriene cream is is a type of field treatment used to treat precancers, thin skin cancers, and areas of sun damage. Expected reaction includes irritation and mild inflammation potentially progressing to more severe inflammation including redness, scaling, crusting and open sores/erosions.  If too much irritation occurs, ensure application of only a thin layer and decrease frequency of use to achieve a tolerable level of inflammation. Recommend applying Vaseline ointment to open sores as needed.  Minimize sun exposure while under treatment. Recommend daily broad spectrum sunscreen SPF 30+ to sun-exposed areas, reapply every 2 hours as needed.   Actinic keratoses are precancerous spots that appear secondary to cumulative UV radiation exposure/sun exposure over time. They are chronic with expected duration over 1 year. A portion of actinic keratoses will progress to squamous cell carcinoma of the skin. It is not possible to reliably predict which spots will progress to skin cancer and so treatment is recommended to prevent development of skin cancer.  Recommend daily broad spectrum sunscreen SPF 30+ to sun-exposed areas, reapply every 2 hours as needed.  Recommend staying in the shade or wearing long sleeves, sun glasses (UVA+UVB protection) and wide brim hats (4-inch brim around the entire circumference of the hat). Call for new or changing lesions.     fluorouracil (EFUDEX) 5 % cream - L nasal tip x 1, R nasal dorsum x 1, scalp x 15, L eyebrow x 1 Apply topically 2 (two) times daily. Bid to scalp and temples for 10 days  Destruction of lesion - L nasal tip x 1, R nasal dorsum x 1, scalp x 15, L eyebrow x 1  Destruction method: cryotherapy   Informed consent: discussed and consent obtained   Lesion  destroyed using liquid nitrogen: Yes   Region frozen until ice ball extended beyond lesion: Yes   Outcome: patient tolerated procedure well with no complications   Post-procedure details: wound care instructions given   Additional details:  Prior to procedure, discussed risks of blister formation, small wound, skin dyspigmentation, or rare scar following cryotherapy. Recommend Vaseline ointment to treated areas while healing.   Folliculitis chest, scalp, back  Cont Clindamycin gel aa chest, back, scalp qd prn flares Cont Ketoconazole 2% shampoo 2x/wk, let sit 5-10 minutes and rinse off  Pt defers PO doxy at this time  Related Medications ketoconazole (NIZORAL) 2 % shampoo Shampoo into the scalp let sit 5-10 minutes before washing out. Use once to twice weekly.  clindamycin (CLINDAGEL) 1 % gel Apply to aa's back, chest and scalp QD PRN flares   Return in about 6 months (around 08/06/2022) for AK f/u.  I, Othelia Pulling, RMA, am acting as scribe for Brendolyn Patty, MD .  Documentation: I have reviewed the above documentation for accuracy and completeness, and I agree with the above.  Brendolyn Patty MD

## 2022-03-28 ENCOUNTER — Ambulatory Visit: Payer: PRIVATE HEALTH INSURANCE | Admitting: Dermatology

## 2022-08-28 ENCOUNTER — Ambulatory Visit (INDEPENDENT_AMBULATORY_CARE_PROVIDER_SITE_OTHER): Payer: PRIVATE HEALTH INSURANCE | Admitting: Dermatology

## 2022-08-28 ENCOUNTER — Encounter: Payer: Self-pay | Admitting: Dermatology

## 2022-08-28 VITALS — BP 116/73 | HR 91

## 2022-08-28 DIAGNOSIS — L570 Actinic keratosis: Secondary | ICD-10-CM

## 2022-08-28 DIAGNOSIS — L578 Other skin changes due to chronic exposure to nonionizing radiation: Secondary | ICD-10-CM | POA: Diagnosis not present

## 2022-08-28 DIAGNOSIS — Z85828 Personal history of other malignant neoplasm of skin: Secondary | ICD-10-CM | POA: Diagnosis not present

## 2022-08-28 NOTE — Progress Notes (Signed)
Follow-Up Visit   Subjective  Alexander Atkinson is a 62 y.o. male who presents for the following: Actinic keratosis Hx of aks at scalp, forehead, and temples, has used 5 f/u/calcipotriene cream to affected areas off and on.  Hasn't used sequentially. H/o BCC.  The patient has spots, moles and lesions to be evaluated, some may be new or changing and the patient has concerns that these could be cancer.    The following portions of the chart were reviewed this encounter and updated as appropriate: medications, allergies, medical history  Review of Systems:  No other skin or systemic complaints except as noted in HPI or Assessment and Plan.  Objective  Well appearing patient in no apparent distress; mood and affect are within normal limits.  A focused examination was performed of the following areas: Scalp, face, temples, forehead, neck  Relevant exam findings are noted in the Assessment and Plan.    Assessment & Plan     ACTINIC KERATOSIS  Exam: Erythematous thin papules/macules with gritty scale  Actinic keratoses are precancerous spots that appear secondary to cumulative UV radiation exposure/sun exposure over time. They are chronic with expected duration over 1 year. A portion of actinic keratoses will progress to squamous cell carcinoma of the skin. It is not possible to reliably predict which spots will progress to skin cancer and so treatment is recommended to prevent development of skin cancer.  Recommend daily broad spectrum sunscreen SPF 30+ to sun-exposed areas, reapply every 2 hours as needed.  Recommend staying in the shade or wearing long sleeves, sun glasses (UVA+UVB protection) and wide brim hats (4-inch brim around the entire circumference of the hat). Call for new or changing lesions.  Treatment Plan: Will hold cryotherapy treatment to current aks at scalp and forehead. Recommend starting 5 f/u cream at scalp, forehead and temples bid 7-14 days as  directed.   ACTINIC DAMAGE WITH PRECANCEROUS ACTINIC KERATOSES Counseling for Topical Chemotherapy Management: Patient exhibits: - Severe, confluent actinic changes with pre-cancerous actinic keratoses that is secondary to cumulative UV radiation exposure over time - Condition that is severe; chronic, not at goal. - diffuse scaly erythematous macules and papules with underlying dyspigmentation - Discussed Prescription "Field Treatment" topical Chemotherapy for Severe, Chronic Confluent Actinic Changes with Pre-Cancerous Actinic Keratoses Field treatment involves treatment of an entire area of skin that has confluent Actinic Changes (Sun/ Ultraviolet light damage) and PreCancerous Actinic Keratoses by method of PhotoDynamic Therapy (PDT) and/or prescription Topical Chemotherapy agents such as 5-fluorouracil, 5-fluorouracil/calcipotriene, and/or imiquimod.  The purpose is to decrease the number of clinically evident and subclinical PreCancerous lesions to prevent progression to development of skin cancer by chemically destroying early precancer changes that may or may not be visible.  It has been shown to reduce the risk of developing skin cancer in the treated area. As a result of treatment, redness, scaling, crusting, and open sores may occur during treatment course. One or more than one of these methods may be used and may have to be used several times to control, suppress and eliminate the PreCancerous changes. Discussed treatment course, expected reaction, and possible side effects. - Recommend daily broad spectrum sunscreen SPF 30+ to sun-exposed areas, reapply every 2 hours as needed.  - Staying in the shade or wearing long sleeves, sun glasses (UVA+UVB protection) and wide brim hats (4-inch brim around the entire circumference of the hat) are also recommended. - Call for new or changing lesions.  Restart 5-fluorouracil/calcipotriene cream twice a day for 7 (  face)- 14 (scalp) days to affected  areas including scalp, forehead, and temples. Reviewed course of treatment and expected reaction.  Patient advised to expect inflammation and crusting and advised that erosions are possible.  Patient advised to be diligent with sun protection during and after treatment. Counseled to keep medication out of reach of children and pets.  HISTORY OF BASAL CELL CARCINOMA OF THE SKIN Right posterior neck excised 10/02/21- positive deep margin Exam: Hypertrophic scar 2 x 0.8 mm firm pink white plaque  - No evidence of recurrence today - Recommend regular full body skin exams - Recommend daily broad spectrum sunscreen SPF 30+ to sun-exposed areas, reapply every 2 hours as needed.  - Call if any new or changing lesions are noted between office visits  Return for 3 month ak follow up, 6 month ubse .  I, Asher Muir, CMA, am acting as scribe for Willeen Niece, MD.    Documentation: I have reviewed the above documentation for accuracy and completeness, and I agree with the above.  Willeen Niece, MD

## 2022-08-28 NOTE — Patient Instructions (Addendum)
For at least 2 weeks Start fluorouracil/calcipotriene cream twice a day for 10 days to affected areas including scalp.   Take a break in between scalp and forehead/temples   For at least 1 week Start 5FU/Calcipotriene to forehead and  temples bid for 7-10 days       5-Fluorouracil/Calcipotriene Patient Education   Actinic keratoses are the dry, red scaly spots on the skin caused by sun damage. A portion of these spots can turn into skin cancer with time, and treating them can help prevent development of skin cancer.   Treatment of these spots requires removal of the defective skin cells. There are various ways to remove actinic keratoses, including freezing with liquid nitrogen, treatment with creams, or treatment with a blue light procedure in the office.   5-fluorouracil cream is a topical cream used to treat actinic keratoses. It works by interfering with the growth of abnormal fast-growing skin cells, such as actinic keratoses. These cells peel off and are replaced by healthy ones.   5-fluorouracil/calcipotriene is a combination of the 5-fluorouracil cream with a vitamin D analog cream called calcipotriene. The calcipotriene alone does not treat actinic keratoses. However, when it is combined with 5-fluorouracil, it helps the 5-fluorouracil treat the actinic keratoses much faster so that the same results can be achieved with a much shorter treatment time.  INSTRUCTIONS FOR 5-FLUOROURACIL/CALCIPOTRIENE CREAM:   5-fluorouracil/calcipotriene cream typically only needs to be used for 4-7 days. A thin layer should be applied twice a day to the treatment areas recommended by your physician.   If your physician prescribed you separate tubes of 5-fluourouracil and calcipotriene, apply a thin layer of 5-fluorouracil followed by a thin layer of calcipotriene.   Avoid contact with your eyes, nostrils, and mouth. Do not use 5-fluorouracil/calcipotriene cream on infected or open wounds.   You  will develop redness, irritation and some crusting at areas where you have pre-cancer damage/actinic keratoses. IF YOU DEVELOP PAIN, BLEEDING, OR SIGNIFICANT CRUSTING, STOP THE TREATMENT EARLY - you have already gotten a good response and the actinic keratoses should clear up well.  Wash your hands after applying 5-fluorouracil 5% cream on your skin.   A moisturizer or sunscreen with a minimum SPF 30 should be applied each morning.   Once you have finished the treatment, you can apply a thin layer of Vaseline twice a day to irritated areas to soothe and calm the areas more quickly. If you experience significant discomfort, contact your physician.  For some patients it is necessary to repeat the treatment for best results.  SIDE EFFECTS: When using 5-fluorouracil/calcipotriene cream, you may have mild irritation, such as redness, dryness, swelling, or a mild burning sensation. This usually resolves within 2 weeks. The more actinic keratoses you have, the more redness and inflammation you can expect during treatment. Eye irritation has been reported rarely. If this occurs, please let us know.  If you have any trouble using this cream, please call the office. If you have any other questions about this information, please do not hesitate to ask me before you leave the office.      Due to recent changes in healthcare laws, you may see results of your pathology and/or laboratory studies on MyChart before the doctors have had a chance to review them. We understand that in some cases there may be results that are confusing or concerning to you. Please understand that not all results are received at the same time and often the doctors may need to interpret  multiple results in order to provide you with the best plan of care or course of treatment. Therefore, we ask that you please give [place] 2 business days to thoroughly review all your results before contacting the office for clarification. Should we see a  critical lab result, you will be contacted sooner.   If You Need Anything After Your Visit  If you have any questions or concerns for your doctor, please call our main line at [phone] and press option 4 to reach your doctor's medical assistant. If no one answers, please leave a voicemail as directed and we will return your call as soon as possible. Messages left after 4 pm will be answered the following business day.   You may also send [place] a message via MyChart. We typically respond to MyChart messages within 1-2 business days.  For prescription refills, please ask your pharmacy to contact our office. Our fax number is [phone].  If you have an urgent issue when the clinic is closed that cannot wait until the next business day, you can page your doctor at the number below.    Please note that while we do our best to be available for urgent issues outside of office hours, we are not available 24/7.   If you have an urgent issue and are unable to reach [place], you may choose to seek medical care at your doctor's office, retail clinic, urgent care center, or emergency room.  If you have a medical emergency, please immediately call 911 or go to the emergency department.  Pager Numbers  - Dr. Gwen Pounds: [phone]  - Dr. Neale Burly: [phone]  - Dr. Roseanne Reno: [phone]  In the event of inclement weather, please call our main line at [phone] for an update on the status of any delays or closures.  Dermatology Medication Tips: Please keep the boxes that topical medications come in in order to help keep track of the instructions about where and how to use these. Pharmacies typically print the medication instructions only on the boxes and not directly on the medication tubes.   If your medication is too expensive, please contact our office at [phone] option 4 or send [place] a message through MyChart.   We are unable to tell what your co-pay for medications will be in advance as  this is different depending on your insurance coverage. However, we may be able to find a substitute medication at lower cost or fill out paperwork to get insurance to cover a needed medication.   If a prior authorization is required to get your medication covered by your insurance company, please allow [place] 1-2 business days to complete this process.  Drug prices often vary depending on where the prescription is filled and some pharmacies may offer cheaper prices.  The website www.goodrx.com contains coupons for medications through different pharmacies. The prices here do not account for what the cost may be with help from insurance (it may be cheaper with your insurance), but the website can give you the price if you did not use any insurance.  - You can print the associated coupon and take it with your prescription to the pharmacy.  - You may also stop by our office during regular business hours and pick up a GoodRx coupon card.  - If you need your prescription sent electronically to a different pharmacy, notify our office through [facility] or by phone at [phone] option 4.     Si Usted Necesita Algo Despus de Su Visita  Tambin puede enviarnos un mensaje a travs de MyChart. Por lo general respondemos a los mensajes de MyChart en el transcurso de 1 a 2 das hbiles.  Para renovar recetas, por favor pida a su farmacia que se ponga en contacto con nuestra oficina. Annie Sable de fax es [place] [phone].  Si tiene un asunto urgente cuando la clnica est cerrada y que no puede esperar hasta el siguiente da hbil, puede llamar/localizar a su doctor(a) al nmero que aparece a continuacin.   Por favor, tenga en cuenta que aunque hacemos todo lo posible para estar disponibles para asuntos urgentes fuera del horario de [place], no estamos disponibles las 24 horas del da, los 7 [address]  [address] de la [place].   Si tiene un problema urgente y no puede comunicarse con nosotros, puede optar por  buscar atencin mdica  en el consultorio de su doctor(a), en una clnica privada, en un centro de atencin urgente o en una sala de emergencias.  Si tiene Engineer, drilling, por favor llame inmediatamente al 911 o vaya a la sala de emergencias.  Nmeros de bper  - Dr. Gwen Pounds: [phone]  - Dra. Moye: [phone]  - Dra. Roseanne Reno: [phone]  En caso de inclemencias del [place], por favor llame a Lacy Duverney principal al [phone] para una actualizacin sobre el [place] de cualquier retraso o cierre.  Consejos para la medicacin en dermatologa: Por favor, guarde las cajas en las que vienen los medicamentos de uso tpico para ayudarle a seguir las instrucciones sobre dnde y cmo usarlos. Las farmacias generalmente imprimen las instrucciones del medicamento slo en las cajas y no directamente en los tubos del [place].   Si su medicamento es muy caro, por favor, pngase en contacto con Rolm Gala llamando al [phone] y presione la opcin 4 o envenos un mensaje a travs de Clinical cytogeneticist.   No podemos decirle cul ser su copago por los medicamentos por adelantado ya que esto es diferente dependiendo de la cobertura de su seguro. Sin embargo, es posible que podamos encontrar un medicamento sustituto a Audiological scientist un formulario para que el seguro cubra el medicamento que se considera necesario.   Si se requiere una autorizacin previa para que su compaa de seguros [place] su medicamento, por favor permtanos de 1 a 2 das hbiles para completar [address]  Los precios de los medicamentos varan con frecuencia dependiendo del Environmental consultant de dnde se surte la receta y alguna farmacias pueden ofrecer precios ms baratos.  El sitio web www.goodrx.com tiene cupones para medicamentos de Health and safety inspector. Los precios aqu no tienen en cuenta lo que podra costar con la ayuda del seguro (puede ser ms barato con su seguro), pero el sitio web puede darle el precio si no  utiliz Tourist information centre manager.  - Puede imprimir el cupn correspondiente y llevarlo con su receta a la farmacia.  - Tambin puede pasar por nuestra oficina durante el horario de atencin regular y Education officer, museum una tarjeta de cupones de GoodRx.  - Si necesita que su receta se enve electrnicamente a una farmacia diferente, informe a nuestra oficina a travs de MyChart de [place] o por telfono llamando al [phone] y presione la opcin 4.

## 2022-11-05 ENCOUNTER — Ambulatory Visit (INDEPENDENT_AMBULATORY_CARE_PROVIDER_SITE_OTHER): Payer: Self-pay | Admitting: Dermatology

## 2022-11-05 VITALS — BP 122/67 | HR 89

## 2022-11-05 DIAGNOSIS — L814 Other melanin hyperpigmentation: Secondary | ICD-10-CM

## 2022-11-05 NOTE — Patient Instructions (Addendum)
Due to recent changes in healthcare laws, you may see results of your pathology and/or laboratory studies on MyChart before the doctors have had a chance to review them. We understand that in some cases there may be results that are confusing or concerning to you. Please understand that not all results are received at the same time and often the doctors may need to interpret multiple results in order to provide you with the best plan of care or course of treatment. Therefore, we ask that you please give us 2 business days to thoroughly review all your results before contacting the office for clarification. Should we see a critical lab result, you will be contacted sooner.   If You Need Anything After Your Visit  If you have any questions or concerns for your doctor, please call our main line at [phone] and press option 4 to reach your doctor's medical assistant. If no one answers, please leave a voicemail as directed and we will return your call as soon as possible. Messages left after 4 pm will be answered the following business day.   You may also send us a message via MyChart. We typically respond to MyChart messages within 1-2 business days.  For prescription refills, please ask your pharmacy to contact our office. Our fax number is [phone].  If you have an urgent issue when the clinic is closed that cannot wait until the next business day, you can page your doctor at the number below.    Please note that while we do our best to be available for urgent issues outside of office hours, we are not available 24/7.   If you have an urgent issue and are unable to reach us, you may choose to seek medical care at your doctor's office, retail clinic, urgent care center, or emergency room.  If you have a medical emergency, please immediately call 911 or go to the emergency department.  Pager Numbers  - Dr. Kowalski: [phone]  - Dr. Moye: [phone]  - Dr. Stewart:  [phone]  In the event of inclement weather, please call our main line at [phone] for an update on the status of any delays or closures.  Dermatology Medication Tips: Please keep the boxes that topical medications come in in order to help keep track of the instructions about where and how to use these. Pharmacies typically print the medication instructions only on the boxes and not directly on the medication tubes.   If your medication is too expensive, please contact our office at [phone] option 4 or send us a message through MyChart.   We are unable to tell what your co-pay for medications will be in advance as this is different depending on your insurance coverage. However, we may be able to find a substitute medication at lower cost or fill out paperwork to get insurance to cover a needed medication.   If a prior authorization is required to get your medication covered by your insurance company, please allow us 1-2 business days to complete this process.  Drug prices often vary depending on where the prescription is filled and some pharmacies may offer cheaper prices.  The website www.goodrx.com contains coupons for medications through different pharmacies. The prices here do not account for what the cost may be with help from insurance (it may be cheaper with your insurance), but the website can give you the price if you did not use any insurance.  - You can print the associated coupon and take it with   your prescription to the pharmacy.  - You may also stop by our office during regular business hours and pick up a GoodRx coupon card.  - If you need your prescription sent electronically to a different pharmacy, notify our office through [facility] MyChart or by phone at [phone] option 4.     Si Usted Necesita Algo Despus de Su Visita  Tambin puede enviarnos un mensaje a travs de MyChart. Por lo general respondemos a los mensajes de MyChart en el transcurso de 1 a 2  das hbiles.  Para renovar recetas, por favor pida a su farmacia que se ponga en contacto con nuestra oficina. Nuestro nmero de fax es el [phone].  Si tiene un asunto urgente cuando la clnica est cerrada y que no puede esperar hasta el siguiente da hbil, puede llamar/localizar a su doctor(a) al nmero que aparece a continuacin.   Por favor, tenga en cuenta que aunque hacemos todo lo posible para estar disponibles para asuntos urgentes fuera del horario de oficina, no estamos disponibles las 24 horas del da, los 7 das de la semana.   Si tiene un problema urgente y no puede comunicarse con nosotros, puede optar por buscar atencin mdica  en el consultorio de su doctor(a), en una clnica privada, en un centro de atencin urgente o en una sala de emergencias.  Si tiene una emergencia mdica, por favor llame inmediatamente al 911 o vaya a la sala de emergencias.  Nmeros de bper  - Dr. Kowalski: [phone]  - Dra. Moye: [phone]  - Dra. Stewart: [phone]  En caso de inclemencias del tiempo, por favor llame a nuestra lnea principal al [phone] para una actualizacin sobre el estado de cualquier retraso o cierre.  Consejos para la medicacin en dermatologa: Por favor, guarde las cajas en las que vienen los medicamentos de uso tpico para ayudarle a seguir las instrucciones sobre dnde y cmo usarlos. Las farmacias generalmente imprimen las instrucciones del medicamento slo en las cajas y no directamente en los tubos del medicamento.   Si su medicamento es muy caro, por favor, pngase en contacto con nuestra oficina llamando al [phone] y presione la opcin 4 o envenos un mensaje a travs de MyChart.   No podemos decirle cul ser su copago por los medicamentos por adelantado ya que esto es diferente dependiendo de la cobertura de su seguro. Sin embargo, es posible que podamos encontrar un medicamento sustituto a menor costo o llenar un formulario para que el  seguro cubra el medicamento que se considera necesario.   Si se requiere una autorizacin previa para que su compaa de seguros cubra su medicamento, por favor permtanos de 1 a 2 das hbiles para completar este proceso.  Los precios de los medicamentos varan con frecuencia dependiendo del lugar de dnde se surte la receta y alguna farmacias pueden ofrecer precios ms baratos.  El sitio web www.goodrx.com tiene cupones para medicamentos de diferentes farmacias. Los precios aqu no tienen en cuenta lo que podra costar con la ayuda del seguro (puede ser ms barato con su seguro), pero el sitio web puede darle el precio si no utiliz ningn seguro.  - Puede imprimir el cupn correspondiente y llevarlo con su receta a la farmacia.  - Tambin puede pasar por nuestra oficina durante el horario de atencin regular y recoger una tarjeta de cupones de GoodRx.  - Si necesita que su receta se enve electrnicamente a una farmacia diferente, informe a nuestra oficina a travs de MyChart de [place]   o por telfono llamando al [phone] y presione la opcin 4.  

## 2022-11-05 NOTE — Progress Notes (Signed)
   Follow-Up Visit   Subjective  Alexander Atkinson is a 62 y.o. male who presents for the following: bbl at hands.    The following portions of the chart were reviewed this encounter and updated as appropriate: medications, allergies, medical history  Review of Systems:  No other skin or systemic complaints except as noted in HPI or Assessment and Plan.  Objective  Well appearing patient in no apparent distress; mood and affect are within normal limits.   A focused examination was performed of the following areas: B/l hands   Relevant exam findings are noted in the Assessment and Plan.  b/l dorsal hands Brown macules               Assessment & Plan   Lentigo b/l dorsal hands  Treated with BBL today  Recommend daily broad spectrum sunscreen SPF 30+ to sun-exposed areas, reapply every 2 hours as needed. Call for new or changing lesions.  Staying in the shade or wearing long sleeves, sun glasses (UVA+UVB protection) and wide brim hats (4-inch brim around the entire circumference of the hat) are also recommended for sun protection.     Photorejuvenation - b/l dorsal hands Prior to the procedure, the patient's past medical history, medications, allergies, and the rare but potential risks and complications were reviewed with the patient and a signed consent was obtained.  Pre and post treatment care was discussed and instructions provided.   [place] BBL - 11/05/22 1400      Patient Details   Current Skin Care: hands    Skin Type: II    Anesthestic Cream Applied: No    Photo Takes: Yes    Consent Signed: Yes      Treatment Details   Date: 11/05/22    Treatment #: 515    Area: hands    Filter: 1st Pass;2nd Pass;3rd Pass      1st Pass   Location: Other   hands   BBL j/cm2: 12    PW Msec Sec: 10    Cooling Temp: 25    Pulses: 63    15x45: used      2nd Pass   Location: Other   hands   Device: 515    BBL j/cm2: 12    PW Msec Sec: 10    Cooling Temp: 25     Pulses: 31    15x15: used      3rd Pass   Location: Other   hands   Device: 515    BBL j/cm2: 15    PW Msec Sec: 15    Cooling Temp: 25    Pulses: 19    15x15: used          Patient tolerated the procedure well.   Wynelle Link avoidance was stressed. The patient will call with any problems, questions or concerns prior to their next appointment.      Return as scheduled.  I, Asher Muir, CMA, am acting as scribe for Willeen Niece, MD.   Documentation: I have reviewed the above documentation for accuracy and completeness, and I agree with the above.  Willeen Niece, MD

## 2022-12-03 ENCOUNTER — Ambulatory Visit (INDEPENDENT_AMBULATORY_CARE_PROVIDER_SITE_OTHER): Payer: PRIVATE HEALTH INSURANCE | Admitting: Dermatology

## 2022-12-03 VITALS — BP 122/82

## 2022-12-03 DIAGNOSIS — Z85828 Personal history of other malignant neoplasm of skin: Secondary | ICD-10-CM

## 2022-12-03 DIAGNOSIS — L578 Other skin changes due to chronic exposure to nonionizing radiation: Secondary | ICD-10-CM

## 2022-12-03 DIAGNOSIS — W908XXA Exposure to other nonionizing radiation, initial encounter: Secondary | ICD-10-CM | POA: Diagnosis not present

## 2022-12-03 DIAGNOSIS — L570 Actinic keratosis: Secondary | ICD-10-CM

## 2022-12-03 DIAGNOSIS — L820 Inflamed seborrheic keratosis: Secondary | ICD-10-CM | POA: Diagnosis not present

## 2022-12-03 DIAGNOSIS — Z7189 Other specified counseling: Secondary | ICD-10-CM

## 2022-12-03 NOTE — Progress Notes (Signed)
Follow-Up Visit   Subjective  Alexander Atkinson is a 62 y.o. male who presents for the following: AK 60m f/u, face, scalp, pt used 5FU/Calcipotriene cr to scalp, forehead and temples with good reaction pt did not use for 7 days, thinks it was 3-4 days.  Spot on shoulder gets irritated.   The following portions of the chart were reviewed this encounter and updated as appropriate: medications, allergies, medical history  Review of Systems:  No other skin or systemic complaints except as noted in HPI or Assessment and Plan.  Objective  Well appearing patient in no apparent distress; mood and affect are within normal limits.   A focused examination was performed of the following areas: Face, scalp  Relevant exam findings are noted in the Assessment and Plan.  R frontal scalp x 1, L zygoma x 1, vertex scalp x 3, crown scalp x 2, R temple x 1, R sideburn x 1, R cheek x 1, L temple x 2 (12) Pink scaly macules  R post shoulder x 1 Stuck on waxy paps with erythema    Assessment & Plan   HISTORY OF BASAL CELL CARCINOMA OF THE SKIN - No evidence of recurrence today- R post neck, hypotrophic scar - Recommend regular full body skin exams - Recommend daily broad spectrum sunscreen SPF 30+ to sun-exposed areas, reapply every 2 hours as needed.  - Call if any new or changing lesions are noted between office visits    AK (actinic keratosis) (12) R frontal scalp x 1, L zygoma x 1, vertex scalp x 3, crown scalp x 2, R temple x 1, R sideburn x 1, R cheek x 1, L temple x 2  Vs ISK  Actinic keratoses are precancerous spots that appear secondary to cumulative UV radiation exposure/sun exposure over time. They are chronic with expected duration over 1 year. A portion of actinic keratoses will progress to squamous cell carcinoma of the skin. It is not possible to reliably predict which spots will progress to skin cancer and so treatment is recommended to prevent development of skin cancer.  Recommend  daily broad spectrum sunscreen SPF 30+ to sun-exposed areas, reapply every 2 hours as needed.  Recommend staying in the shade or wearing long sleeves, sun glasses (UVA+UVB protection) and wide brim hats (4-inch brim around the entire circumference of the hat). Call for new or changing lesions.  ACTINIC DAMAGE WITH PRECANCEROUS ACTINIC KERATOSES Counseling for Topical Chemotherapy Management: Patient exhibits: - Severe, confluent actinic changes with pre-cancerous actinic keratoses that is secondary to cumulative UV radiation exposure over time - Condition that is severe; chronic, not at goal. - diffuse scaly erythematous macules and papules with underlying dyspigmentation - Discussed Prescription "Field Treatment" topical Chemotherapy for Severe, Chronic Confluent Actinic Changes with Pre-Cancerous Actinic Keratoses Field treatment involves treatment of an entire area of skin that has confluent Actinic Changes (Sun/ Ultraviolet light damage) and PreCancerous Actinic Keratoses by method of PhotoDynamic Therapy (PDT) and/or prescription Topical Chemotherapy agents such as 5-fluorouracil, 5-fluorouracil/calcipotriene, and/or imiquimod.  The purpose is to decrease the number of clinically evident and subclinical PreCancerous lesions to prevent progression to development of skin cancer by chemically destroying early precancer changes that may or may not be visible.  It has been shown to reduce the risk of developing skin cancer in the treated area. As a result of treatment, redness, scaling, crusting, and open sores may occur during treatment course. One or more than one of these methods may be used and may  have to be used several times to control, suppress and eliminate the PreCancerous changes. Discussed treatment course, expected reaction, and possible side effects. - Recommend daily broad spectrum sunscreen SPF 30+ to sun-exposed areas, reapply every 2 hours as needed.  - Staying in the shade or wearing  long sleeves, sun glasses (UVA+UVB protection) and wide brim hats (4-inch brim around the entire circumference of the hat) are also recommended. - Call for new or changing lesions.  - In the fall - Start 5-fluorouracil/calcipotriene cream twice a day for 7 days to affected areas including scalp, forehead, and temples.  Patient provided with handout reviewing treatment course and side effects and advised to call or message [place] on MyChart with any concerns.  Reviewed course of treatment and expected reaction.  Patient advised to expect inflammation and crusting and advised that erosions are possible.  Patient advised to be diligent with sun protection during and after treatment. Counseled to keep medication out of reach of children and pets.   Destruction of lesion - R frontal scalp x 1, L zygoma x 1, vertex scalp x 3, crown scalp x 2, R temple x 1, R sideburn x 1, R cheek x 1, L temple x 2 (12)  Destruction method: cryotherapy   Informed consent: discussed and consent obtained   Lesion destroyed using liquid nitrogen: Yes   Region frozen until ice ball extended beyond lesion: Yes   Outcome: patient tolerated procedure well with no complications   Post-procedure details: wound care instructions given   Additional details:  Prior to procedure, discussed risks of blister formation, small wound, skin dyspigmentation, or rare scar following cryotherapy. Recommend Vaseline ointment to treated areas while healing.   Related Medications fluorouracil (EFUDEX) 5 % cream Apply topically 2 (two) times daily. Bid to scalp and temples for 10 days  Inflamed seborrheic keratosis R post shoulder x 1  Symptomatic, irritating, patient would like treated.   Destruction of lesion - R post shoulder x 1  Destruction method: cryotherapy   Informed consent: discussed and consent obtained   Lesion destroyed using liquid nitrogen: Yes   Region frozen until ice ball extended beyond lesion: Yes   Outcome: patient  tolerated procedure well with no complications   Post-procedure details: wound care instructions given   Additional details:  Prior to procedure, discussed risks of blister formation, small wound, skin dyspigmentation, or rare scar following cryotherapy. Recommend Vaseline ointment to treated areas while healing.     Return in about 6 months (around 06/05/2023) for AK f/u.  I, Ardis Rowan, RMA, am acting as scribe for Willeen Niece, MD .   Documentation: I have reviewed the above documentation for accuracy and completeness, and I agree with the above.  Willeen Niece, MD

## 2022-12-03 NOTE — Patient Instructions (Addendum)
Cryotherapy Aftercare  Wash gently with soap and water everyday.   Apply Vaseline and Band-Aid daily until healed.   In the fall - Start 5-fluorouracil/calcipotriene cream twice a day for 7 days to affected areas including scalp, forehead, and temples.   Due to recent changes in healthcare laws, you may see results of your pathology and/or laboratory studies on MyChart before the doctors have had a chance to review them. We understand that in some cases there may be results that are confusing or concerning to you. Please understand that not all results are received at the same time and often the doctors may need to interpret multiple results in order to provide you with the best plan of care or course of treatment. Therefore, we ask that you please give [place] 2 business days to thoroughly review all your results before contacting the office for clarification. Should we see a critical lab result, you will be contacted sooner.   If You Need Anything After Your Visit  If you have any questions or concerns for your doctor, please call our main line at [phone] and press option 4 to reach your doctor's medical assistant. If no one answers, please leave a voicemail as directed and we will return your call as soon as possible. Messages left after 4 pm will be answered the following business day.   You may also send [place] a message via MyChart. We typically respond to MyChart messages within 1-2 business days.  For prescription refills, please ask your pharmacy to contact our office. Our fax number is [phone].  If you have an urgent issue when the clinic is closed that cannot wait until the next business day, you can page your doctor at the number below.    Please note that while we do our best to be available for urgent issues outside of office hours, we are not available 24/7.   If you have an urgent issue and are unable to reach [place], you may choose to seek medical care at your doctor's office,  retail clinic, urgent care center, or emergency room.  If you have a medical emergency, please immediately call 911 or go to the emergency department.  Pager Numbers  - Dr. Gwen Pounds: [phone]  - Dr. Neale Burly: [phone]  - Dr. Roseanne Reno: [phone]  In the event of inclement weather, please call our main line at [phone] for an update on the status of any delays or closures.  Dermatology Medication Tips: Please keep the boxes that topical medications come in in order to help keep track of the instructions about where and how to use these. Pharmacies typically print the medication instructions only on the boxes and not directly on the medication tubes.   If your medication is too expensive, please contact our office at [phone] option 4 or send [place] a message through MyChart.   We are unable to tell what your co-pay for medications will be in advance as this is different depending on your insurance coverage. However, we may be able to find a substitute medication at lower cost or fill out paperwork to get insurance to cover a needed medication.   If a prior authorization is required to get your medication covered by your insurance company, please allow [place] 1-2 business days to complete this process.  Drug prices often vary depending on where the prescription is filled and some pharmacies may offer cheaper prices.  The website www.goodrx.com contains coupons for medications through different pharmacies. The prices here do not account  for what the cost may be with help from insurance (it may be cheaper with your insurance), but the website can give you the price if you did not use any insurance.  - You can print the associated coupon and take it with your prescription to the pharmacy.  - You may also stop by our office during regular business hours and pick up a GoodRx coupon card.  - If you need your prescription sent electronically to a different pharmacy, notify our office through  [facility] or by phone at [phone] option 4.     Si Usted Necesita Algo Despus de Su Visita  Tambin puede enviarnos un mensaje a travs de Clinical cytogeneticist. Por lo general respondemos a los mensajes de MyChart en el transcurso de 1 a 2 das hbiles.  Para renovar recetas, por favor pida a su farmacia que se ponga en contacto con nuestra oficina. Annie Sable de fax es [place] [phone].  Si tiene un asunto urgente cuando la clnica est cerrada y que no puede esperar hasta el siguiente da hbil, puede llamar/localizar a su doctor(a) al nmero que aparece a continuacin.   Por favor, tenga en cuenta que aunque hacemos todo lo posible para estar disponibles para asuntos urgentes fuera del horario de [place], no estamos disponibles las 24 horas del da, los 7 [address]  [address] de la [place].   Si tiene un problema urgente y no puede comunicarse con nosotros, puede optar por buscar atencin mdica  en el consultorio de su doctor(a), en una clnica privada, en un centro de atencin urgente o en una sala de emergencias.  Si tiene Engineer, drilling, por favor llame inmediatamente al 911 o vaya a la sala de emergencias.  Nmeros de bper  - Dr. Gwen Pounds: [phone]  - Dra. Moye: [phone]  - Dra. Roseanne Reno: [phone]  En caso de inclemencias del [place], por favor llame a Lacy Duverney principal al [phone] para una actualizacin sobre el [place] de cualquier retraso o cierre.  Consejos para la medicacin en dermatologa: Por favor, guarde las cajas en las que vienen los medicamentos de uso tpico para ayudarle a seguir las instrucciones sobre dnde y cmo usarlos. Las farmacias generalmente imprimen las instrucciones del medicamento slo en las cajas y no directamente en los tubos del [place].   Si su medicamento es muy caro, por favor, pngase en contacto con Rolm [place] llamando al [phone] y presione la opcin 4 o envenos un mensaje a travs de Clinical cytogeneticist.   No podemos  decirle cul ser su copago por los medicamentos por adelantado ya que esto es diferente dependiendo de la cobertura de su seguro. Sin embargo, es posible que podamos encontrar un medicamento sustituto a Audiological scientist un formulario para que el seguro cubra el medicamento que se considera necesario.   Si se requiere una autorizacin previa para que su compaa de seguros [place] su medicamento, por favor permtanos de 1 a 2 das hbiles para completar [address]  Los precios de los medicamentos varan con frecuencia dependiendo del Environmental consultant de dnde se surte la receta y alguna farmacias pueden ofrecer precios ms baratos.  El sitio web www.goodrx.com tiene cupones para medicamentos de Health and safety inspector. Los precios aqu no tienen en cuenta lo que podra costar con la ayuda del seguro (puede ser ms barato con su seguro), pero el sitio web puede darle el precio si no utiliz Tourist information centre manager.  - Puede imprimir el cupn correspondiente y llevarlo con su receta a la farmacia.  Laroy Apple  puede pasar por nuestra oficina durante el horario de atencin regular y Education officer, museum una tarjeta de cupones de GoodRx.  - Si necesita que su receta se enve electrnicamente a una farmacia diferente, informe a nuestra oficina a travs de MyChart de [place] o por telfono llamando al [phone] y presione la opcin 4.

## 2023-03-12 ENCOUNTER — Ambulatory Visit (INDEPENDENT_AMBULATORY_CARE_PROVIDER_SITE_OTHER): Payer: PRIVATE HEALTH INSURANCE | Admitting: Dermatology

## 2023-03-12 DIAGNOSIS — W908XXA Exposure to other nonionizing radiation, initial encounter: Secondary | ICD-10-CM | POA: Diagnosis not present

## 2023-03-12 DIAGNOSIS — L820 Inflamed seborrheic keratosis: Secondary | ICD-10-CM | POA: Diagnosis not present

## 2023-03-12 DIAGNOSIS — L578 Other skin changes due to chronic exposure to nonionizing radiation: Secondary | ICD-10-CM

## 2023-03-12 DIAGNOSIS — L739 Follicular disorder, unspecified: Secondary | ICD-10-CM

## 2023-03-12 DIAGNOSIS — Z85828 Personal history of other malignant neoplasm of skin: Secondary | ICD-10-CM

## 2023-03-12 NOTE — Progress Notes (Signed)
Follow-Up Visit   Subjective  Alexander Atkinson is a 62 y.o. male who presents for the following: Actinic keratosis.  The patient has spots, moles and lesions to be evaluated, some may be new or changing. He has a few spots on his scalp and right temple to check.   The following portions of the chart were reviewed this encounter and updated as appropriate: medications, allergies, medical history.  Review of Systems:  No other skin or systemic complaints except as noted in HPI or Assessment and Plan.  Objective  Well appearing patient in no apparent distress; mood and affect are within normal limits.  A focused examination was performed of the following areas: Face, scalp  Relevant exam findings are noted in the Assessment and Plan.  R temporal hairline x 1 Erythematous stuck-on, waxy papule   inf vertex x 4, crown x 6, L post shoulder x 1 (11) Pink waxy scaly macules/papules.    Assessment & Plan   Inflamed seborrheic keratosis R temporal hairline x 1  Symptomatic, irritating, patient would like treated.  Pt prefers to pay for out of pocket  Discussed cosmetic procedure cryotherapy, noncovered.  $60 for 1st lesion and $15 for each additional lesion if done on the same day.  Maximum charge $350.  One touch-up treatment included no charge. Discussed risks of treatment including dyspigmentation, small scar, and/or recurrence. Recommend daily broad spectrum sunscreen SPF 30+/photoprotection to treated areas once healed.   Destruction of lesion - R temporal hairline x 1  Destruction method: cryotherapy   Informed consent: discussed and consent obtained   Lesion destroyed using liquid nitrogen: Yes   Region frozen until ice ball extended beyond lesion: Yes   Outcome: patient tolerated procedure well with no complications   Post-procedure details: wound care instructions given   Additional details:  Prior to procedure, discussed risks of blister formation, small wound, skin  dyspigmentation, or rare scar following cryotherapy. Recommend Vaseline ointment to treated areas while healing.   Seborrheic keratosis, inflamed (11) inf vertex x 4, crown x 6, L post shoulder x 1  vs AK  Actinic keratoses are precancerous spots that appear secondary to cumulative UV radiation exposure/sun exposure over time. They are chronic with expected duration over 1 year. A portion of actinic keratoses will progress to squamous cell carcinoma of the skin. It is not possible to reliably predict which spots will progress to skin cancer and so treatment is recommended to prevent development of skin cancer.  Recommend daily broad spectrum sunscreen SPF 30+ to sun-exposed areas, reapply every 2 hours as needed.  Recommend staying in the shade or wearing long sleeves, sun glasses (UVA+UVB protection) and wide brim hats (4-inch brim around the entire circumference of the hat). Call for new or changing lesions.  Pt prefers to pay for out of pocket- see above pricing  Destruction of lesion - inf vertex x 4, crown x 6, L post shoulder x 1 (11)  Destruction method: cryotherapy   Informed consent: discussed and consent obtained   Lesion destroyed using liquid nitrogen: Yes   Region frozen until ice ball extended beyond lesion: Yes   Outcome: patient tolerated procedure well with no complications   Post-procedure details: wound care instructions given   Additional details:  Prior to procedure, discussed risks of blister formation, small wound, skin dyspigmentation, or rare scar following cryotherapy. Recommend Vaseline ointment to treated areas while healing.   Folliculitis  Related Medications ketoconazole (NIZORAL) 2 % shampoo Shampoo into the scalp let  sit 5-10 minutes before washing out. Use once to twice weekly.  clindamycin (CLINDAGEL) 1 % gel Apply to aa's back, chest and scalp QD PRN flares  Actinic skin damage  History of basal cell carcinoma (BCC) of skin   ACTINIC  DAMAGE - chronic, secondary to cumulative UV radiation exposure/sun exposure over time - diffuse scaly erythematous macules with underlying dyspigmentation - Recommend daily broad spectrum sunscreen SPF 30+ to sun-exposed areas, reapply every 2 hours as needed.  - Recommend staying in the shade or wearing long sleeves, sun glasses (UVA+UVB protection) and wide brim hats (4-inch brim around the entire circumference of the hat). - Call for new or changing lesions.   HISTORY OF BASAL CELL CARCINOMA OF THE SKIN R neck, hypertrophic scar - No evidence of recurrence today - Recommend regular full body skin exams - Recommend daily broad spectrum sunscreen SPF 30+ to sun-exposed areas, reapply every 2 hours as needed.  - Call if any new or changing lesions are noted between office visits  FOLLICULITIS Exam: Perifollicular erythematous papules and pustules, neck  Folliculitis occurs due to inflammation of the superficial hair follicle (pore), resulting in acne-like lesions (pus bumps). It can be infectious (bacterial, fungal) or noninfectious (shaving, tight clothing, heat/sweat, medications).  Folliculitis can be acute or chronic and recommended treatment depends on the underlying cause of folliculitis.  Treatment Plan: Continue Clindamycin gel every day prn flares.  Sample of La-Roche Posay Benzoyl Peroxide treatment. Benzoyl peroxide can cause dryness and irritation of the skin. It can also bleach fabric.   Return as scheduled.  ICherlyn Labella, CMA, am acting as scribe for Willeen Niece, MD .   Documentation: I have reviewed the above documentation for accuracy and completeness, and I agree with the above.  Willeen Niece, MD

## 2023-03-12 NOTE — Patient Instructions (Addendum)

## 2023-06-17 ENCOUNTER — Ambulatory Visit (INDEPENDENT_AMBULATORY_CARE_PROVIDER_SITE_OTHER): Payer: Self-pay | Admitting: Dermatology

## 2023-06-17 ENCOUNTER — Encounter: Payer: Self-pay | Admitting: Dermatology

## 2023-06-17 DIAGNOSIS — L821 Other seborrheic keratosis: Secondary | ICD-10-CM

## 2023-06-17 DIAGNOSIS — Z85828 Personal history of other malignant neoplasm of skin: Secondary | ICD-10-CM

## 2023-06-17 DIAGNOSIS — L570 Actinic keratosis: Secondary | ICD-10-CM

## 2023-06-17 DIAGNOSIS — Z5111 Encounter for antineoplastic chemotherapy: Secondary | ICD-10-CM

## 2023-06-17 DIAGNOSIS — L578 Other skin changes due to chronic exposure to nonionizing radiation: Secondary | ICD-10-CM

## 2023-06-17 DIAGNOSIS — W908XXA Exposure to other nonionizing radiation, initial encounter: Secondary | ICD-10-CM

## 2023-06-17 NOTE — Patient Instructions (Addendum)
-Recommend Starting 5-fluorouracil/calcipotriene cream twice a day for 4-7 days to affected areas including scalp and temples.   Cryotherapy Aftercare  Wash gently with soap and water everyday.   Apply Vaseline and Band-Aid daily until healed.    Due to recent changes in healthcare laws, you may see results of your pathology and/or laboratory studies on MyChart before the doctors have had a chance to review them. We understand that in some cases there may be results that are confusing or concerning to you. Please understand that not all results are received at the same time and often the doctors may need to interpret multiple results in order to provide you with the best plan of care or course of treatment. Therefore, we ask that you please give [place] 2 business days to thoroughly review all your results before contacting the office for clarification. Should we see a critical lab result, you will be contacted sooner.   If You Need Anything After Your Visit  If you have any questions or concerns for your doctor, please call our main line at [phone] and press option 4 to reach your doctor's medical assistant. If no one answers, please leave a voicemail as directed and we will return your call as soon as possible. Messages left after 4 pm will be answered the following business day.   You may also send [place] a message via MyChart. We typically respond to MyChart messages within 1-2 business days.  For prescription refills, please ask your pharmacy to contact our office. Our fax number is [phone].  If you have an urgent issue when the clinic is closed that cannot wait until the next business day, you can page your doctor at the number below.    Please note that while we do our best to be available for urgent issues outside of office hours, we are not available 24/7.   If you have an urgent issue and are unable to reach [place], you may choose to seek medical care at your doctor's office, retail  clinic, urgent care center, or emergency room.  If you have a medical emergency, please immediately call 911 or go to the emergency department.  Pager Numbers  - Dr. Gwen Pounds: [phone]  - Dr. Roseanne Reno: [phone]  - Dr. Katrinka Blazing: [phone]   In the event of inclement weather, please call our main line at [phone] for an update on the status of any delays or closures.  Dermatology Medication Tips: Please keep the boxes that topical medications come in in order to help keep track of the instructions about where and how to use these. Pharmacies typically print the medication instructions only on the boxes and not directly on the medication tubes.   If your medication is too expensive, please contact our office at [phone] option 4 or send [place] a message through MyChart.   We are unable to tell what your co-pay for medications will be in advance as this is different depending on your insurance coverage. However, we may be able to find a substitute medication at lower cost or fill out paperwork to get insurance to cover a needed medication.   If a prior authorization is required to get your medication covered by your insurance company, please allow [place] 1-2 business days to complete this process.  Drug prices often vary depending on where the prescription is filled and some pharmacies may offer cheaper prices.  The website www.goodrx.com contains coupons for medications through different pharmacies. The prices here do not account for what  the cost may be with help from insurance (it may be cheaper with your insurance), but the website can give you the price if you did not use any insurance.  - You can print the associated coupon and take it with your prescription to the pharmacy.  - You may also stop by our office during regular business hours and pick up a GoodRx coupon card.  - If you need your prescription sent electronically to a different pharmacy, notify our office through [facility] or by phone at [phone] option 4.     Si Usted Necesita Algo Despus de Su Visita  Tambin puede enviarnos un mensaje a travs de Clinical cytogeneticist. Por lo general respondemos a los mensajes de MyChart en el transcurso de 1 a 2 das hbiles.  Para renovar recetas, por favor pida a su farmacia que se ponga en contacto con nuestra oficina. Annie Sable de fax es [place] [phone].  Si tiene un asunto urgente cuando la clnica est cerrada y que no puede esperar hasta el siguiente da hbil, puede llamar/localizar a su doctor(a) al nmero que aparece a continuacin.   Por favor, tenga en cuenta que aunque hacemos todo lo posible para estar disponibles para asuntos urgentes fuera del horario de [place], no estamos disponibles las 24 horas del da, los 7 [address]  [address] de la [place].   Si tiene un problema urgente y no puede comunicarse con nosotros, puede optar por buscar atencin mdica  en el consultorio de su doctor(a), en una clnica privada, en un centro de atencin urgente o en una sala de emergencias.  Si tiene Engineer, drilling, por favor llame inmediatamente al 911 o vaya a la sala de emergencias.  Nmeros de bper  - Dr. Gwen Pounds: [phone]  - Dra. Roseanne Reno: [phone]  - Dr. Katrinka Blazing: [phone]   En caso de inclemencias del tiempo, por favor llame a Lacy Duverney principal al [phone] para una actualizacin sobre el [place] de cualquier retraso o cierre.  Consejos para la medicacin en dermatologa: Por favor, guarde las cajas en las que vienen los medicamentos de uso tpico para ayudarle a seguir las instrucciones sobre dnde y cmo usarlos. Las farmacias generalmente imprimen las instrucciones del medicamento slo en las cajas y no directamente en los tubos del [place].   Si su medicamento es muy caro, por favor, pngase en contacto con Rolm [place]ala llamando al [phone] y presione la opcin 4 o envenos un mensaje a travs de Clinical cytogeneticist.   No podemos  decirle cul ser su copago por los medicamentos por adelantado ya que esto es diferente dependiendo de la cobertura de su seguro. Sin embargo, es posible que podamos encontrar un medicamento sustituto a Audiological scientist un formulario para que el seguro cubra el medicamento que se considera necesario.   Si se requiere una autorizacin previa para que su compaa de seguros [place] su medicamento, por favor permtanos de 1 a 2 das hbiles para completar [address]  Los precios de los medicamentos varan con frecuencia dependiendo del Environmental consultant de dnde se surte la receta y alguna farmacias pueden ofrecer precios ms baratos.  El sitio web www.goodrx.com tiene cupones para medicamentos de Health and safety inspector. Los precios aqu no tienen en cuenta lo que podra costar con la ayuda del seguro (puede ser ms barato con su seguro), pero el sitio web puede darle el precio si no utiliz Tourist information centre manager.  - Puede imprimir el cupn correspondiente y llevarlo con su receta a la farmacia.  - Tambin puede  pasar por nuestra oficina durante el horario de atencin regular y Education officer, museum una tarjeta de cupones de GoodRx.  - Si necesita que su receta se enve electrnicamente a una farmacia diferente, informe a nuestra oficina a travs de MyChart de [place] o por telfono llamando al [phone] y presione la opcin 4.

## 2023-06-17 NOTE — Progress Notes (Signed)
Follow-Up Visit   Subjective  Alexander Atkinson is a 63 y.o. male who presents for the following: AK 67m f/u scalp, face, used 5FU/Calcipotriene recently to R and L temple with resulting redness, scaling, only applied twice. The patient has spots, moles and lesions to be evaluated, some may be new or changing and the patient may have concern these could be cancer.   The following portions of the chart were reviewed this encounter and updated as appropriate: medications, allergies, medical history  Review of Systems:  No other skin or systemic complaints except as noted in HPI or Assessment and Plan.  Objective  Well appearing patient in no apparent distress; mood and affect are within normal limits.   A focused examination was performed of the following areas: Scalp, face  Relevant exam findings are noted in the Assessment and Plan.  R crown scalp x 4, L scalp x 2 (6) Pink scaly macules  Assessment & Plan   ACTINIC DAMAGE WITH PRECANCEROUS ACTINIC KERATOSES Counseling for Topical Chemotherapy Management: Patient exhibits: - Severe, confluent actinic changes with pre-cancerous actinic keratoses that is secondary to cumulative UV radiation exposure over time - Condition that is severe; chronic, not at goal. - diffuse scaly erythematous macules and papules with underlying dyspigmentation - Discussed Prescription "Field Treatment" topical Chemotherapy for Severe, Chronic Confluent Actinic Changes with Pre-Cancerous Actinic Keratoses Field treatment involves treatment of an entire area of skin that has confluent Actinic Changes (Sun/ Ultraviolet light damage) and PreCancerous Actinic Keratoses by method of PhotoDynamic Therapy (PDT) and/or prescription Topical Chemotherapy agents such as 5-fluorouracil, 5-fluorouracil/calcipotriene, and/or imiquimod.  The purpose is to decrease the number of clinically evident and subclinical PreCancerous lesions to prevent progression to development of skin  cancer by chemically destroying early precancer changes that may or may not be visible.  It has been shown to reduce the risk of developing skin cancer in the treated area. As a result of treatment, redness, scaling, crusting, and open sores may occur during treatment course. One or more than one of these methods may be used and may have to be used several times to control, suppress and eliminate the PreCancerous changes. Discussed treatment course, expected reaction, and possible side effects. - Recommend daily broad spectrum sunscreen SPF 30+ to sun-exposed areas, reapply every 2 hours as needed.  - Staying in the shade or wearing long sleeves, sun glasses (UVA+UVB protection) and wide brim hats (4-inch brim around the entire circumference of the hat) are also recommended. - Call for new or changing lesions.  -Recommend Starting 5-fluorouracil/calcipotriene cream twice a day for 4-7 days to affected areas including scalp and temples.   AK (ACTINIC KERATOSIS) (6) R crown scalp x 4, L scalp x 2 (6) Actinic keratoses are precancerous spots that appear secondary to cumulative UV radiation exposure/sun exposure over time. They are chronic with expected duration over 1 year. A portion of actinic keratoses will progress to squamous cell carcinoma of the skin. It is not possible to reliably predict which spots will progress to skin cancer and so treatment is recommended to prevent development of skin cancer.  Recommend daily broad spectrum sunscreen SPF 30+ to sun-exposed areas, reapply every 2 hours as needed.  Recommend staying in the shade or wearing long sleeves, sun glasses (UVA+UVB protection) and wide brim hats (4-inch brim around the entire circumference of the hat). Call for new or changing lesions. Destruction of lesion - R crown scalp x 4, L scalp x 2 (6)  Destruction method: cryotherapy  Informed consent: discussed and consent obtained   Lesion destroyed using liquid nitrogen: Yes   Region  frozen until ice ball extended beyond lesion: Yes   Outcome: patient tolerated procedure well with no complications   Post-procedure details: wound care instructions given   Additional details:  Prior to procedure, discussed risks of blister formation, small wound, skin dyspigmentation, or rare scar following cryotherapy. Recommend Vaseline ointment to treated areas while healing.   HISTORY OF BASAL CELL CARCINOMA OF THE SKIN - No evidence of recurrence today- R post neck - Recommend regular full body skin exams - Recommend daily broad spectrum sunscreen SPF 30+ to sun-exposed areas, reapply every 2 hours as needed.  - Call if any new or changing lesions are noted between office visits    SEBORRHEIC KERATOSIS face - Stuck-on, waxy, tan-brown papules and/or plaques  - Benign-appearing - Discussed benign etiology and prognosis. - Observe - Call for any changes  Return in about 6 months (around 12/15/2023) for AK f/u.  I, Ardis Rowan, RMA, am acting as scribe for Willeen Niece, MD .   Documentation: I have reviewed the above documentation for accuracy and completeness, and I agree with the above.  Willeen Niece, MD

## 2023-10-30 ENCOUNTER — Telehealth: Payer: Self-pay

## 2023-10-30 NOTE — Telephone Encounter (Signed)
 Patient came into the office asking to speak to me (I was not here). Called patient on cell but no answer and VM box full. aw

## 2023-12-17 ENCOUNTER — Ambulatory Visit: Payer: PRIVATE HEALTH INSURANCE | Admitting: Dermatology

## 2023-12-19 ENCOUNTER — Ambulatory Visit (INDEPENDENT_AMBULATORY_CARE_PROVIDER_SITE_OTHER): Payer: PRIVATE HEALTH INSURANCE | Admitting: Dermatology

## 2023-12-19 ENCOUNTER — Encounter: Payer: Self-pay | Admitting: Dermatology

## 2023-12-19 DIAGNOSIS — W908XXA Exposure to other nonionizing radiation, initial encounter: Secondary | ICD-10-CM

## 2023-12-19 DIAGNOSIS — L578 Other skin changes due to chronic exposure to nonionizing radiation: Secondary | ICD-10-CM

## 2023-12-19 DIAGNOSIS — Z85828 Personal history of other malignant neoplasm of skin: Secondary | ICD-10-CM | POA: Diagnosis not present

## 2023-12-19 DIAGNOSIS — L570 Actinic keratosis: Secondary | ICD-10-CM

## 2023-12-19 NOTE — Patient Instructions (Addendum)
 Cryotherapy Aftercare  Wash gently with soap and water everyday.   Apply Vaseline and Band-Aid daily until healed.   - Start 5-fluorouracil /calcipotriene cream twice a day for 5 days to affected areas including forehead and temples. May also treat scalp twice a day for 7-10 days. Prescription sent to [facility]. Patient advised they will receive an email to purchase the medication online and have it sent to their home. Patient provided with handout reviewing treatment course and side effects and advised to call or message us  on MyChart with any concerns.  Reviewed course of treatment and expected reaction.  Patient advised to expect inflammation and crusting and advised that erosions are possible.  Patient advised to be diligent with sun protection during and after treatment. Counseled to keep medication out of reach of children and pets.  Instructions for [facility]  One or more of your medications was sent to the [facility] mail order compound[facility] pharmacy. You will receive an email from them and can purchase the medicine through that link. It will then be mailed to your home at the address you confirmed. If for any reason you do not receive an email from them, please check your spam folder. If you still do not find the email, please let us  know. [facility] phone number is [phone].    5-Fluorouracil /Calcipotriene Patient Education   Actinic keratoses are the dry, red scaly spots on the skin caused by sun damage. A portion of these spots can turn into skin cancer with time, and treating them can help prevent development of skin cancer.   Treatment of these spots requires removal of the defective skin cells. There are various ways to remove actinic keratoses, including freezing with liquid nitrogen, treatment with creams, or treatment with a blue light procedure in the office.   5-fluorouracil  cream is a topical cream used to treat actinic  keratoses. It works by interfering with the growth of abnormal fast-growing skin cells, such as actinic keratoses. These cells peel off and are replaced by healthy ones.   5-fluorouracil /calcipotriene is a combination of the 5-fluorouracil  cream with a vitamin D analog cream called calcipotriene. The calcipotriene alone does not treat actinic keratoses. However, when it is combined with 5-fluorouracil , it helps the 5-fluorouracil  treat the actinic keratoses much faster so that the same results can be achieved with a much shorter treatment time.  INSTRUCTIONS FOR 5-FLUOROURACIL /CALCIPOTRIENE CREAM:   5-fluorouracil /calcipotriene cream typically only needs to be used for 4-7 days. A thin layer should be applied twice a day to the treatment areas recommended by your physician.   If your physician prescribed you separate tubes of 5-fluourouracil and calcipotriene, apply a thin layer of 5-fluorouracil  followed by a thin layer of calcipotriene.   Avoid contact with your eyes, nostrils, and mouth. Do not use 5-fluorouracil /calcipotriene cream on infected or open wounds.   You will develop redness, irritation and some crusting at areas where you have pre-cancer damage/actinic keratoses. IF YOU DEVELOP PAIN, BLEEDING, OR SIGNIFICANT CRUSTING, STOP THE TREATMENT EARLY - you have already gotten a good response and the actinic keratoses should clear up well.  Wash your hands after applying 5-fluorouracil  5% cream on your skin.   A moisturizer or sunscreen with a minimum SPF 30 should be applied each morning.   Once you have finished the treatment, you can apply a thin layer of Vaseline twice a day to irritated areas to soothe and calm the areas more quickly. If you experience significant discomfort, contact  your physician.  For some patients it is necessary to repeat the treatment for best results.  SIDE EFFECTS: When using 5-fluorouracil /calcipotriene cream, you may have mild irritation, such as redness,  dryness, swelling, or a mild burning sensation. This usually resolves within 2 weeks. The more actinic keratoses you have, the more redness and inflammation you can expect during treatment. Eye irritation has been reported rarely. If this occurs, please let us  know.  If you have any trouble using this cream, please call the office. If you have any other questions about this information, please do not hesitate to ask me before you leave the office. Due to recent changes in healthcare laws, you may see results of your pathology and/or laboratory studies on MyChart before the doctors have had a chance to review them. We understand that in some cases there may be results that are confusing or concerning to you. Please understand that not all results are received at the same time and often the doctors may need to interpret multiple results in order to provide you with the best plan of care or course of treatment. Therefore, we ask that you please give us  2 business days to thoroughly review all your results before contacting the office for clarification. Should we see a critical lab result, you will be contacted sooner.   If You Need Anything After Your Visit  If you have any questions or concerns for your doctor, please call our main line at [phone] and press option 4 to reach your doctor's medical assistant. If no one answers, please leave a voicemail as directed and we will return your call as soon as possible. Messages left after 4 pm will be answered the following business day.   You may also send us  a message via MyChart. We typically respond to MyChart messages within 1-2 business days.  For prescription refills, please ask your pharmacy to contact our office. Our fax number is [phone].  If you have an urgent issue when the clinic is closed that cannot wait until the next business day, you can page your doctor at the number below.    Please note that while we do our best to be available  for urgent issues outside of office hours, we are not available 24/7.   If you have an urgent issue and are unable to reach us , you may choose to seek medical care at your doctor's office, retail clinic, urgent care center, or emergency room.  If you have a medical emergency, please immediately call 911 or go to the emergency department.  Pager Numbers  - Dr. Hester: [phone]  - Dr. Jackquline: [phone]  - Dr. Claudene: [phone]   In the event of inclement weather, please call our main line at [phone] for an update on the status of any delays or closures.  Dermatology Medication Tips: Please keep the boxes that topical medications come in in order to help keep track of the instructions about where and how to use these. Pharmacies typically print the medication instructions only on the boxes and not directly on the medication tubes.   If your medication is too expensive, please contact our office at [phone] option 4 or send us  a message through MyChart.   We are unable to tell what your co-pay for medications will be in advance as this is different depending on your insurance coverage. However, we may be able to find a substitute medication at lower cost or fill out paperwork to get insurance to cover a  needed medication.   If a prior authorization is required to get your medication covered by your insurance company, please allow us  1-2 business days to complete this process.  Drug prices often vary depending on where the prescription is filled and some pharmacies may offer cheaper prices.  The website www.goodrx.com contains coupons for medications through different pharmacies. The prices here do not account for what the cost may be with help from insurance (it may be cheaper with your insurance), but the website can give you the price if you did not use any insurance.  - You can print the associated coupon and take it with your prescription to the pharmacy.  - You may  also stop by our office during regular business hours and pick up a GoodRx coupon card.  - If you need your prescription sent electronically to a different pharmacy, notify our office through [facility] or by phone at [phone] option 4.     Si Usted Necesita Algo Despus de Su Visita  Tambin puede enviarnos un mensaje a travs de Clinical cytogeneticist. Por lo general respondemos a los mensajes de MyChart en el transcurso de 1 a 2 das hbiles.  Para renovar recetas, por favor pida a su farmacia que se ponga en contacto con nuestra oficina. Randi lakes de fax es [place] [phone].  Si tiene un asunto urgente cuando la clnica est cerrada y que no puede esperar hasta el siguiente da hbil, puede llamar/localizar a su doctor(a) al nmero que aparece a continuacin.   Por favor, tenga en cuenta que aunque hacemos todo lo posible para estar disponibles para asuntos urgentes fuera del horario de [place], no estamos disponibles las 24 horas del da, los 7 [address]  [address] de la [place].   Si tiene un problema urgente y no puede comunicarse con nosotros, puede optar por buscar atencin mdica  en el consultorio de su doctor(a), en una clnica privada, en un centro de atencin urgente o en una sala de emergencias.  Si tiene Engineer, drilling, por favor llame inmediatamente al 911 o vaya a la sala de emergencias.  Nmeros de bper  - Dr. Hester: [phone]  - Dra. Jackquline: [phone]  - Dr. Claudene: [phone]   En caso de inclemencias del tiempo, por favor llame a landry capes principal al [phone] para una actualizacin sobre el [place] de cualquier retraso o cierre.  Consejos para la medicacin en dermatologa: Por favor, guarde las cajas en las que vienen los medicamentos de uso tpico para ayudarle a seguir las instrucciones sobre dnde y cmo usarlos. Las farmacias generalmente imprimen las instrucciones del medicamento slo en las cajas y no directamente en los tubos del [place].    Si su medicamento es muy caro, por favor, pngase en contacto con landry rieger llamando al [phone] y presione la opcin 4 o envenos un mensaje a travs de Clinical cytogeneticist.   No podemos decirle cul ser su copago por los medicamentos por adelantado ya que esto es diferente dependiendo de la cobertura de su seguro. Sin embargo, es posible que podamos encontrar un medicamento sustituto a Audiological scientist un formulario para que el seguro cubra el medicamento que se considera necesario.   Si se requiere una autorizacin previa para que su compaa de seguros malta su medicamento, por favor permtanos de 1 a 2 das hbiles para completar este proceso.  Los precios de los medicamentos varan con frecuencia dependiendo del Environmental consultant de dnde se surte la receta y alguna farmacias pueden ofrecer precios ms baratos.  El sitio web www.goodrx.com tiene cupones para medicamentos de Health and safety inspector. Los precios aqu no tienen en cuenta lo que podra costar con la ayuda del seguro (puede ser ms barato con su seguro), pero el sitio web puede darle el precio si no utiliz Tourist information centre manager.  - Puede imprimir el cupn correspondiente y llevarlo con su receta a la farmacia.  - Tambin puede pasar por nuestra oficina durante el horario de atencin regular y Education officer, museum una tarjeta de cupones de GoodRx.  - Si necesita que su receta se enve electrnicamente a una farmacia diferente, informe a nuestra oficina a travs de MyChart de [place] o por telfono llamando al [phone] y presione la opcin 4.

## 2023-12-19 NOTE — Progress Notes (Signed)
 Follow-Up Visit   Subjective  Alexander Atkinson is a 63 y.o. male who presents for the following: 6 month Actinic keratosis follow-up.  He has been using 5FU/Calcipotriene off and on to spots on the face, but needs a new Rx sent in. The patient has spots, moles and lesions to be evaluated, some may be new or changing. He will be signing up for Medicare soon  The following portions of the chart were reviewed this encounter and updated as appropriate: medications, allergies, medical history  Review of Systems:  No other skin or systemic complaints except as noted in HPI or Assessment and Plan.  Objective  Well appearing patient in no apparent distress; mood and affect are within normal limits.  A focused examination was performed of the following areas: face  Relevant exam findings are noted in the Assessment and Plan.  L temple x 3 , scalp x 1, R temple x 6, mid forehead x 2 (12) Pink scaly macules.   Assessment & Plan  ACTINIC DAMAGE WITH PRECANCEROUS ACTINIC KERATOSES Counseling for Topical Chemotherapy Management: Patient exhibits: - Severe, confluent actinic changes with pre-cancerous actinic keratoses that is secondary to cumulative UV radiation exposure over time - Condition that is severe; chronic, not at goal. - diffuse scaly erythematous macules and papules with underlying dyspigmentation - Discussed Prescription Field Treatment topical Chemotherapy for Severe, Chronic Confluent Actinic Changes with Pre-Cancerous Actinic Keratoses Field treatment involves treatment of an entire area of skin that has confluent Actinic Changes (Sun/ Ultraviolet light damage) and PreCancerous Actinic Keratoses by method of PhotoDynamic Therapy (PDT) and/or prescription Topical Chemotherapy agents such as 5-fluorouracil , 5-fluorouracil /calcipotriene, and/or imiquimod.  The purpose is to decrease the number of clinically evident and subclinical PreCancerous lesions to prevent progression to  development of skin cancer by chemically destroying early precancer changes that may or may not be visible.  It has been shown to reduce the risk of developing skin cancer in the treated area. As a result of treatment, redness, scaling, crusting, and open sores may occur during treatment course. One or more than one of these methods may be used and may have to be used several times to control, suppress and eliminate the PreCancerous changes. Discussed treatment course, expected reaction, and possible side effects. - Recommend daily broad spectrum sunscreen SPF 30+ to sun-exposed areas, reapply every 2 hours as needed.  - Staying in the shade or wearing long sleeves, sun glasses (UVA+UVB protection) and wide brim hats (4-inch brim around the entire circumference of the hat) are also recommended. - Call for new or changing lesions. - Start 5-fluorouracil /calcipotriene cream twice a day for 5 days to affected areas including forehead and temples. May also treat scalp twice a day for 7-10 days. Prescription sent to [facility]. Patient advised they will receive an email to purchase the medication online and have it sent to their home. Patient provided with handout reviewing treatment course and side effects and advised to call or message us  on MyChart with any concerns.  Reviewed course of treatment and expected reaction.  Patient advised to expect inflammation and crusting and advised that erosions are possible.  Patient advised to be diligent with sun protection during and after treatment. Counseled to keep medication out of reach of children and pets.  HISTORY OF BASAL CELL CARCINOMA OF THE SKIN - No evidence of recurrence today- R post neck - Recommend regular full body skin exams - Recommend daily broad spectrum sunscreen SPF 30+ to sun-exposed areas, reapply  every 2 hours as needed.  - Call if any new or changing lesions are noted between office visits   AK (ACTINIC  KERATOSIS) (12) L temple x 3 , scalp x 1, R temple x 6, mid forehead x 2 (12) Actinic keratoses are precancerous spots that appear secondary to cumulative UV radiation exposure/sun exposure over time. They are chronic with expected duration over 1 year. A portion of actinic keratoses will progress to squamous cell carcinoma of the skin. It is not possible to reliably predict which spots will progress to skin cancer and so treatment is recommended to prevent development of skin cancer.  Recommend daily broad spectrum sunscreen SPF 30+ to sun-exposed areas, reapply every 2 hours as needed.  Recommend staying in the shade or wearing long sleeves, sun glasses (UVA+UVB protection) and wide brim hats (4-inch brim around the entire circumference of the hat). Call for new or changing lesions. Destruction of lesion - L temple x 3 , scalp x 1, R temple x 6, mid forehead x 2 (12)  Destruction method: cryotherapy   Informed consent: discussed and consent obtained   Lesion destroyed using liquid nitrogen: Yes   Region frozen until ice ball extended beyond lesion: Yes   Outcome: patient tolerated procedure well with no complications   Post-procedure details: wound care instructions given   Additional details:  Prior to procedure, discussed risks of blister formation, small wound, skin dyspigmentation, or rare scar following cryotherapy. Recommend Vaseline ointment to treated areas while healing.    Return in about 6 months (around 06/20/2024) for AKs, UBSE.  IAndrea Kerns, CMA, am acting as scribe for Rexene Rattler, MD .   Documentation: I have reviewed the above documentation for accuracy and completeness, and I agree with the above.  Rexene Rattler, MD

## 2024-03-10 ENCOUNTER — Ambulatory Visit (INDEPENDENT_AMBULATORY_CARE_PROVIDER_SITE_OTHER): Payer: PRIVATE HEALTH INSURANCE | Admitting: Dermatology

## 2024-03-10 ENCOUNTER — Encounter: Payer: Self-pay | Admitting: Dermatology

## 2024-03-10 DIAGNOSIS — L739 Follicular disorder, unspecified: Secondary | ICD-10-CM | POA: Diagnosis not present

## 2024-03-10 DIAGNOSIS — L723 Sebaceous cyst: Secondary | ICD-10-CM

## 2024-03-10 MED ORDER — DOXYCYCLINE MONOHYDRATE 100 MG PO CAPS: 100.0000 mg | ORAL_CAPSULE | Freq: Two times a day (BID) | ORAL | 0 refills | Status: AC

## 2024-03-10 NOTE — Patient Instructions (Addendum)
 Start Doxycycline  100 mg 1 capsule twice daily with food for 2 weeks then once daily with food until finished.   Start Clindamycin  gel twice daily to affected area on neck.   Recommend OTC benzoyl peroxide cleanser, wash affected areas daily in shower, let sit several minutes prior to rinsing.  May bleach towels if not rinsed off completely.  Recommended brands include Panoxyl 4% Creamy Wash, CeraVe Acne Foaming Cream wash, or Cetaphil Gentle Clear Complexion-Clearing BPO Acne Cleanser.  Doxycycline  should be taken with food to prevent nausea. Do not lay down for 30 minutes after taking. Be cautious with sun exposure and use good sun protection while on this medication. Pregnant women should not take this medication.      Recommend daily broad spectrum sunscreen SPF 30+ to sun-exposed areas, reapply every 2 hours as needed. Call for new or changing lesions.  Staying in the shade or wearing long sleeves, sun glasses (UVA+UVB protection) and wide brim hats (4-inch brim around the entire circumference of the hat) are also recommended for sun protection.     Due to recent changes in healthcare laws, you may see results of your pathology and/or laboratory studies on MyChart before the doctors have had a chance to review them. We understand that in some cases there may be results that are confusing or concerning to you. Please understand that not all results are received at the same time and often the doctors may need to interpret multiple results in order to provide you with the best plan of care or course of treatment. Therefore, we ask that you please give us  2 business days to thoroughly review all your results before contacting the office for clarification. Should we see a critical lab result, you will be contacted sooner.   If You Need Anything After Your Visit  If you have any questions or concerns for your doctor, please call our main line at [phone] and press option 4 to reach your  doctor's medical assistant. If no one answers, please leave a voicemail as directed and we will return your call as soon as possible. Messages left after 4 pm will be answered the following business day.   You may also send us  a message via MyChart. We typically respond to MyChart messages within 1-2 business days.  For prescription refills, please ask your pharmacy to contact our office. Our fax number is [phone].  If you have an urgent issue when the clinic is closed that cannot wait until the next business day, you can page your doctor at the number below.    Please note that while we do our best to be available for urgent issues outside of office hours, we are not available 24/7.   If you have an urgent issue and are unable to reach us , you may choose to seek medical care at your doctor's office, retail clinic, urgent care center, or emergency room.  If you have a medical emergency, please immediately call 911 or go to the emergency department.  Pager Numbers  - Dr. Hester: [phone]  - Dr. Jackquline: [phone]  - Dr. Claudene: [phone]   - Dr. Raymund: [phone]  In the event of inclement weather, please call our main line at [phone] for an update on the status of any delays or closures.  Dermatology Medication Tips: Please keep the boxes that topical medications come in in order to help keep track of the instructions about where and how to use these. Pharmacies typically print the medication instructions  only on the boxes and not directly on the medication tubes.   If your medication is too expensive, please contact our office at [phone] option 4 or send us  a message through MyChart.   We are unable to tell what your co-pay for medications will be in advance as this is different depending on your insurance coverage. However, we may be able to find a substitute medication at lower cost or fill out paperwork to get insurance to cover a needed medication.    If a prior authorization is required to get your medication covered by your insurance company, please allow us  1-2 business days to complete this process.  Drug prices often vary depending on where the prescription is filled and some pharmacies may offer cheaper prices.  The website www.goodrx.com contains coupons for medications through different pharmacies. The prices here do not account for what the cost may be with help from insurance (it may be cheaper with your insurance), but the website can give you the price if you did not use any insurance.  - You can print the associated coupon and take it with your prescription to the pharmacy.  - You may also stop by our office during regular business hours and pick up a GoodRx coupon card.  - If you need your prescription sent electronically to a different pharmacy, notify our office through [facility] or by phone at [phone] option 4.     Si Usted Necesita Algo Despus de Su Visita  Tambin puede enviarnos un mensaje a travs de Clinical Cytogeneticist. Por lo general respondemos a los mensajes de MyChart en el transcurso de 1 a 2 das hbiles.  Para renovar recetas, por favor pida a su farmacia que se ponga en contacto con nuestra oficina. Randi lakes de fax es [place] [phone].  Si tiene un asunto urgente cuando la clnica est cerrada y que no puede esperar hasta el siguiente da hbil, puede llamar/localizar a su doctor(a) al nmero que aparece a continuacin.   Por favor, tenga en cuenta que aunque hacemos todo lo posible para estar disponibles para asuntos urgentes fuera del horario de [place], no estamos disponibles las 24 horas del da, los 7 [address]  [address] de la [place].   Si tiene un problema urgente y no puede comunicarse con nosotros, puede optar por buscar atencin mdica  en el consultorio de su doctor(a), en una clnica privada, en un centro de atencin urgente o en una sala de emergencias.  Si tiene engineer, drilling, por favor  llame inmediatamente al 911 o vaya a la sala de emergencias.  Nmeros de bper  - Dr. Hester: [phone]  - Dra. Jackquline: [phone]  - Dr. Claudene: [phone]  - Dra. Kitts: [phone]  En caso de inclemencias del [place], por favor llame a nuestra lnea principal al [phone] para una actualizacin sobre el estado de cualquier retraso o cierre.  Consejos para la medicacin en dermatologa: Por favor, guarde las cajas en las que vienen los medicamentos de uso tpico para ayudarle a seguir las instrucciones sobre dnde y cmo usarlos. Las farmacias generalmente imprimen las instrucciones del medicamento slo en las cajas y no directamente en los tubos del [place].   Si su medicamento es muy caro, por favor, pngase en contacto con landry rieger llamando al [phone] y presione la opcin 4 o envenos un mensaje a travs de Clinical Cytogeneticist.   No podemos decirle cul ser su copago por los medicamentos por adelantado ya que esto es diferente dependiendo de la cobertura de  su seguro. Sin embargo, es posible que podamos encontrar un medicamento sustituto a audiological scientist un formulario para que el seguro cubra el medicamento que se considera necesario.   Si se requiere una autorizacin previa para que su compaa de seguros cubra su medicamento, por favor permtanos de 1 a 2 das hbiles para completar este proceso.  Los precios de los medicamentos varan con frecuencia dependiendo del environmental consultant de dnde se surte la receta y alguna farmacias pueden ofrecer precios ms baratos.  El sitio web www.goodrx.com tiene cupones para medicamentos de health and safety inspector. Los precios aqu no tienen en cuenta lo que podra costar con la ayuda del seguro (puede ser ms barato con su seguro), pero el sitio web puede darle el precio si no utiliz tourist information centre manager.  - Puede imprimir el cupn correspondiente y llevarlo con su receta a la farmacia.  - Tambin puede pasar por nuestra oficina durante el  horario de atencin regular y education officer, museum una tarjeta de cupones de GoodRx.  - Si necesita que su receta se enve electrnicamente a una farmacia diferente, informe a nuestra oficina a travs de MyChart de [place] o por telfono llamando al [phone] y presione la opcin 4.

## 2024-03-10 NOTE — Progress Notes (Signed)
   Follow-Up Visit   Subjective  Alexander Atkinson is a 63 y.o. male who presents for the following: Spot on right side of neck behind ear. Lesion is near [facility] excision site.  Dur: 1 day.   The patient has spots, moles and lesions to be evaluated, some may be new or changing and the patient may have concern these could be cancer.    The following portions of the chart were reviewed this encounter and updated as appropriate: medications, allergies, medical history  Review of Systems:  No other skin or systemic complaints except as noted in HPI or Assessment and Plan.  Objective  Well appearing patient in no apparent distress; mood and affect are within normal limits.  A focused examination was performed of the following areas: Head, neck  Relevant physical exam findings are noted in the Assessment and Plan.          Assessment & Plan   FOLLICULITIS, Inflamed CYST vs other Exam: Perifollicular erythematous papule  Folliculitis occurs due to inflammation of the superficial hair follicle (pore), resulting in acne-like lesions (pus bumps). It can be infectious (bacterial, fungal) or noninfectious (shaving, tight clothing, heat/sweat, medications).  Folliculitis can be acute or chronic and recommended treatment depends on the underlying cause of folliculitis.  Treatment Plan:  Start Doxycycline  100 mg 1 capsule twice daily with food for 2 weeks then once daily until finished.   Start Clindamycin  gel twice daily to affected area on neck.   Recommend OTC benzoyl peroxide cleanser, wash affected areas daily in shower, let sit several minutes prior to rinsing.  May bleach towels if not rinsed off completely.  Recommended brands include Panoxyl 4% Creamy Wash, CeraVe Acne Foaming Cream wash, or Cetaphil Gentle Clear Complexion-Clearing BPO Acne Cleanser.  Doxycycline  should be taken with food to prevent nausea. Do not lay down for 30 minutes after taking. Be cautious with sun exposure  and use good sun protection while on this medication. Pregnant women should not take this medication.   If not improved on f/up, consider biopsy since adjacent to previous BCC site.    Return for Spot recheck/neck in 6-8 weeks.  I, Jill Parcell, CMA, am acting as scribe for Rexene Rattler, MD.   Documentation: I have reviewed the above documentation for accuracy and completeness, and I agree with the above.  Rexene Rattler, MD

## 2024-04-21 ENCOUNTER — Ambulatory Visit: Payer: PRIVATE HEALTH INSURANCE | Admitting: Dermatology

## 2024-04-21 ENCOUNTER — Encounter: Payer: Self-pay | Admitting: Dermatology

## 2024-04-21 DIAGNOSIS — D492 Neoplasm of unspecified behavior of bone, soft tissue, and skin: Secondary | ICD-10-CM

## 2024-04-21 DIAGNOSIS — L578 Other skin changes due to chronic exposure to nonionizing radiation: Secondary | ICD-10-CM

## 2024-04-21 NOTE — Patient Instructions (Signed)

## 2024-04-21 NOTE — Progress Notes (Signed)
   Follow-Up Visit   Subjective  Alexander Atkinson is a 63 y.o. male who presents for the following: recheck lesion on right neck behind ear. Took doxycycline  as directed and used Clindamycin  gel as directed. He states there was no change. Recently area has gotten larger.   The patient has spots, moles and lesions to be evaluated, some may be new or changing and the patient may have concern these could be cancer.    The following portions of the chart were reviewed this encounter and updated as appropriate: medications, allergies, medical history  Review of Systems:  No other skin or systemic complaints except as noted in HPI or Assessment and Plan.  Objective  Well appearing patient in no apparent distress; mood and affect are within normal limits.  A focused examination was performed of the following areas: Neck   Relevant physical exam findings are noted in the Assessment and Plan.  Right Postauricular Neck 1.9 x 1.1 cm firm pink flesh nodule at [facility] site   Assessment & Plan   ACTINIC DAMAGE - chronic, secondary to cumulative UV radiation exposure/sun exposure over time - diffuse scaly erythematous macules with underlying dyspigmentation - Recommend daily broad spectrum sunscreen SPF 30+ to sun-exposed areas, reapply every 2 hours as needed.  - Recommend staying in the shade or wearing long sleeves, sun glasses (UVA+UVB protection) and wide brim hats (4-inch brim around the entire circumference of the hat). - Call for new or changing lesions.  NEOPLASM OF SKIN Right Postauricular Neck Epidermal / dermal shaving  Lesion diameter (cm):  1.9 Informed consent: discussed and consent obtained   Patient was prepped and draped in usual sterile fashion: Area prepped with alcohol. Anesthesia: the lesion was anesthetized in a standard fashion   Anesthetic:  1% lidocaine w/ epinephrine 1-100,000 buffered w/ 8.4% NaHCO3 Instrument used: flexible razor blade   Hemostasis achieved with:  pressure, aluminum chloride and electrodesiccation   Outcome: patient tolerated procedure well   Post-procedure details: wound care instructions given   Post-procedure details comment:  Ointment and small bandage applied  Specimen 1 - Surgical pathology Differential Diagnosis: hypertrophic scar R/O recurrent nodular BCC.  Check Margins: No  Previous pathology: [id number],  [phone]  2 pieces of specimen in bottle.  Suspicious for recurrence, original excision had positive deep margin. Plan Mohs pending pathology results.    Return for AK Follow Up As Scheduled.  I, Jill Parcell, CMA, am acting as scribe for Rexene Rattler, MD.   Documentation: I have reviewed the above documentation for accuracy and completeness, and I agree with the above.  Rexene Rattler, MD

## 2024-04-27 ENCOUNTER — Ambulatory Visit: Payer: Self-pay | Admitting: Dermatology

## 2024-04-27 LAB — SURGICAL PATHOLOGY

## 2024-04-27 NOTE — Telephone Encounter (Signed)
 Left message for patient to call back for biopsy results.

## 2024-04-27 NOTE — Telephone Encounter (Signed)
-----   Message from Rexene Rattler, MD sent at 04/27/2024  1:07 PM EST ----- 1. Skin, right postauricular neck :       DERMAL SCAR, NO RESIDUAL BASAL CELL CARCINOMA SEEN, MARGINS ARE FREE   Benign scar tissue, no further treatment needed at this time - please call patient

## 2024-04-28 NOTE — Telephone Encounter (Signed)
-----   Message from Rexene Rattler, MD sent at 04/27/2024  1:07 PM EST ----- 1. Skin, right postauricular neck :       DERMAL SCAR, NO RESIDUAL BASAL CELL CARCINOMA SEEN, MARGINS ARE FREE   Benign scar tissue, no further treatment needed at this time - please call patient

## 2024-04-28 NOTE — Telephone Encounter (Signed)
 Lft pt msg to call for bx result/sh

## 2024-05-04 NOTE — Telephone Encounter (Signed)
-----   Message from Rexene Rattler, MD sent at 04/27/2024  1:07 PM EST ----- 1. Skin, right postauricular neck :       DERMAL SCAR, NO RESIDUAL BASAL CELL CARCINOMA SEEN, MARGINS ARE FREE   Benign scar tissue, no further treatment needed at this time - please call patient

## 2024-05-04 NOTE — Telephone Encounter (Signed)
 Advised pt of bx result/sh ?

## 2024-06-22 ENCOUNTER — Ambulatory Visit: Payer: PRIVATE HEALTH INSURANCE | Admitting: Dermatology
